# Patient Record
Sex: Male | Born: 1970
Health system: Southern US, Community
[De-identification: ages and names within clinical notes are randomized; demographics above are authoritative.]

## PROBLEM LIST (undated history)

## (undated) DIAGNOSIS — M549 Dorsalgia, unspecified: Secondary | ICD-10-CM

## (undated) DIAGNOSIS — I4891 Unspecified atrial fibrillation: Principal | ICD-10-CM

## (undated) DIAGNOSIS — F419 Anxiety disorder, unspecified: Secondary | ICD-10-CM

## (undated) DIAGNOSIS — I10 Essential (primary) hypertension: Secondary | ICD-10-CM

## (undated) DIAGNOSIS — R030 Elevated blood-pressure reading, without diagnosis of hypertension: Secondary | ICD-10-CM

## (undated) DIAGNOSIS — Z9119 Patient's noncompliance with other medical treatment and regimen: Secondary | ICD-10-CM

## (undated) DIAGNOSIS — R7303 Prediabetes: Secondary | ICD-10-CM

## (undated) DIAGNOSIS — K219 Gastro-esophageal reflux disease without esophagitis: Secondary | ICD-10-CM

## (undated) DIAGNOSIS — F32A Depression, unspecified: Secondary | ICD-10-CM

## (undated) DIAGNOSIS — Z91199 Patient's noncompliance with other medical treatment and regimen due to unspecified reason: Secondary | ICD-10-CM

## (undated) HISTORY — DX: Anxiety disorder, unspecified: F41.9

## (undated) HISTORY — DX: Dorsalgia, unspecified: M54.9

## (undated) HISTORY — DX: Depression, unspecified: F32.A

## (undated) HISTORY — DX: Essential (primary) hypertension: I10

## (undated) HISTORY — PX: COLONOSCOPY: SHX174

---

## 2003-10-25 ENCOUNTER — Inpatient Hospital Stay (HOSPITAL_COMMUNITY): Admission: EM | Admit: 2003-10-25 | Discharge: 2003-10-27 | Payer: Self-pay | Admitting: Emergency Medicine

## 2003-10-26 ENCOUNTER — Encounter: Payer: Self-pay | Admitting: Internal Medicine

## 2004-12-06 ENCOUNTER — Emergency Department (HOSPITAL_COMMUNITY): Admission: EM | Admit: 2004-12-06 | Discharge: 2004-12-06 | Payer: Self-pay | Admitting: Family Medicine

## 2005-07-17 ENCOUNTER — Observation Stay (HOSPITAL_COMMUNITY): Admission: AD | Admit: 2005-07-17 | Discharge: 2005-07-18 | Payer: Self-pay | Admitting: Internal Medicine

## 2005-07-17 ENCOUNTER — Ambulatory Visit: Payer: Self-pay | Admitting: Cardiology

## 2005-07-17 ENCOUNTER — Encounter: Payer: Self-pay | Admitting: Emergency Medicine

## 2005-07-17 ENCOUNTER — Encounter: Payer: Self-pay | Admitting: Cardiology

## 2006-02-02 ENCOUNTER — Inpatient Hospital Stay (HOSPITAL_COMMUNITY): Admission: EM | Admit: 2006-02-02 | Discharge: 2006-02-05 | Payer: Self-pay | Admitting: Family Medicine

## 2006-09-04 ENCOUNTER — Emergency Department (HOSPITAL_COMMUNITY): Admission: EM | Admit: 2006-09-04 | Discharge: 2006-09-05 | Payer: Self-pay | Admitting: Emergency Medicine

## 2009-10-01 ENCOUNTER — Emergency Department (HOSPITAL_COMMUNITY): Admission: EM | Admit: 2009-10-01 | Discharge: 2009-10-02 | Payer: Self-pay | Admitting: Emergency Medicine

## 2010-06-01 LAB — DIFFERENTIAL
Basophils Absolute: 0.1 10*3/uL (ref 0.0–0.1)
Basophils Relative: 1 % (ref 0–1)
Eosinophils Absolute: 0.2 10*3/uL (ref 0.0–0.7)
Eosinophils Relative: 2 % (ref 0–5)
Lymphocytes Relative: 35 % (ref 12–46)
Lymphs Abs: 3.3 10*3/uL (ref 0.7–4.0)
Monocytes Absolute: 0.8 10*3/uL (ref 0.1–1.0)
Monocytes Relative: 9 % (ref 3–12)
Neutro Abs: 5 10*3/uL (ref 1.7–7.7)
Neutrophils Relative %: 54 % (ref 43–77)

## 2010-06-01 LAB — POCT CARDIAC MARKERS
CKMB, poc: 1 ng/mL (ref 1.0–8.0)
Myoglobin, poc: 55.7 ng/mL (ref 12–200)
Troponin i, poc: <0.05 ng/mL (ref 0.00–0.09)

## 2010-06-01 LAB — POCT I-STAT, CHEM 8
BUN: 10 mg/dL (ref 6–23)
Calcium, Ion: 1.18 mmol/L (ref 1.12–1.32)
Chloride: 106 mEq/L (ref 96–112)
Creatinine, Ser: 1 mg/dL (ref 0.4–1.5)
Glucose, Bld: 88 mg/dL (ref 70–99)
HCT: 44 % (ref 39.0–52.0)
Hemoglobin: 15 g/dL (ref 13.0–17.0)
Potassium: 4 mEq/L (ref 3.5–5.1)
Sodium: 142 mEq/L (ref 135–145)
TCO2: 27 mmol/L (ref 0–100)

## 2010-06-01 LAB — CBC
HCT: 42.2 % (ref 39.0–52.0)
Hemoglobin: 14.2 g/dL (ref 13.0–17.0)
MCH: 33.4 pg (ref 26.0–34.0)
MCHC: 33.6 g/dL (ref 30.0–36.0)
MCV: 99.5 fL (ref 78.0–100.0)
Platelets: 280 10*3/uL (ref 150–400)
RBC: 4.24 MIL/uL (ref 4.22–5.81)
RDW: 12 % (ref 11.5–15.5)
WBC: 9.4 10*3/uL (ref 4.0–10.5)

## 2010-06-01 LAB — PROTIME-INR
INR: 0.96 (ref 0.00–1.49)
Prothrombin Time: 12.7 seconds (ref 11.6–15.2)

## 2010-06-01 LAB — APTT: aPTT: 28 seconds (ref 24–37)

## 2010-08-02 NOTE — Letter (Signed)
October 09, 2005     Lott Seelbach  [address]  [place], [place] [address]   RE:  SABIN, GIBEAULT  MRN:  [id number]  /  DOB:  1970/11/26   Dear Mr. Oleson:   I am writing this letter to inform you that you are being discharged from  our practice.  You were seen in the hospital back in May of this year.  However, on multiple visits, you have not shown in the past several years.  For clinical follow up with your history of hypertension, atrial  fibrillation, it is imperative that you continue to be followed by a  physician.  We would be happy to see you on an emergent basis, but advise  you to seek some sort of medical attention for continued outpatient follow  up.  Your medical records will be available for another caregiver at your  request.  All that is needed is Release of Information from you.   If you have any questions, please feel free to contact us.   Sincerely,      Pricilla Riffle, MD, [facility]   PVR/MedQ  DD:  10/10/2005  DT:  10/10/2005  Job #:  [id number]

## 2010-08-02 NOTE — H&P (Signed)
NAME:  Edward Johnson, Edward Johnson NO.:  [id number]   MEDICAL RECORD NO.:  [id number]                   PATIENT TYPE:  EMS   LOCATION:  ED                                   FACILITY:  [facility]   PHYSICIAN:  Isla Pence, M.D.             DATE OF BIRTH:  March 30, 1970   DATE OF ADMISSION:  10/24/2003  DATE OF DISCHARGE:                                HISTORY & PHYSICAL   IDENTIFYING INFORMATION/JUSTIFICATION FOR ADMISSION AND CARE:  This is a 40-  year-old African-American gentleman whose primary care physician is  currently Dr. Sherril Croon in [place], [place] although patient has just moved  to [facility] and is going to be choosing a physician within [place].   CHIEF COMPLAINT:  Shortness of breath and heart beating irregularly since  Sunday.   HISTORY OF PRESENT ILLNESS:  Initially the wife was the historian and once  the patient woke up he was able to confirm his history.  This patient has a  previous history of irregular heart beat, was told it was atrial  fibrillation, approximately 10 years ago.  At that time he was put on  Coumadin for a short period of time and since then he has been off of  Coumadin.  He was told that 1 chamber of his heart was thicker than the  other and that was the cause for his atrial fibrillation.  Since then he has  had episodes of irregular heart beats once very 3 months or so, and not  irregular otherwise.  These resolve before he can get to see his physician.  The wife notes that these are usually brought on by nightmares but no other  triggers are noticed.  With this episode or irregular heart beat he did have  a sense of shortness of breath but no chest pain.  No nausea, vomiting and  no orthopnea.  He has had no lower extremity swelling either.  He does drink  caffeinated drinks about 3 or 4 cans per day of soda, but no coffee, does  take small amounts of chocolate but no significant.  He has had no weight  loss medication  over-the-counter in more than a year.  He has not had an  herbal medicines, no decongestants or cold remedies either. He has never  done IV drugs.  He has never had a heart attack.  He does have history of  hypertension.  He has not had any alcoholic beverages over the past 3 weeks.  Prior to that may drink socially maybe 1 can 3x a week at the most.  He does  not have any diabetes mellitus either.  There is no family history of  something similar or any sudden cardiac death.  The patient has had no  recent weight gain, he has gained about 150 pounds since his teenage years.  Denies any weight loss or change in bowel habits either.  Note - in the  emergency room patient received a Cardizem bolus of 20 and placed on a drip  at 5 mg/Hr.   ALLERGIES:  No known drug allergies.   CURRENT MEDICATIONS:  Zestoretic (cannot tell me the dose breakdown on the  Zestoretic).   PAST MEDICAL HISTORY:  1. Significant only for hypertension.  2. No diabetes mellitus, hyperlipidemia or coronary artery disease.   REVIEW OF SYSTEMS:  As per history of present illness.  Once again he denies  orthopnea or chest pain, abdominal pain, denies melena or hematochezia,  denies constipation or diarrhea.  Denies any slurred speech or headache.  When asked if he snores, both patient and wife state yes.  When asked if he  has apneic spells the wife who is a nurse says yes.  Denies any urinary  symptoms of dysuria or hematuria, frequency of urination, also denies  polydipsia.   PAST SURGICAL HISTORY:  He has had minor lacerations that needed sutures in  his left forearm and his right finger but otherwise no major surgery.  He  still has his appendix, gallbladder, tonsils.   SOCIAL HISTORY:  He has been married for the past 7 years.  He has 2  children.  He works as a Runner, broadcasting/film/video.  Does not do any regular  exercise aside from what he does with work.  Does not smoke or drink  alcohol.  And as mentioned  earlier, has not used any IV drugs or any other  form of drugs.   FAMILY HISTORY:  There is hypertension in the parents.  Diabetes mellitus in  the mother.  There is no myocardial infarction or arrhythmias.  There is  rectal cancer in the mother at age 66, she is currently alive at 30.  There  is no prostate, breast or ovarian cancer.   PHYSICAL EXAMINATION:  VITAL SIGNS:  Initial vitals show a temperature of  97.3, blood pressure is 150/92, pulse was 86 but his EKG heart rate was 119  with rapid ventricular rate atrial fibrillation.  His saturations are 98% on  room air.  Respirations are 18.  GENERAL:  He is in no apparent distress.  He is a fairly large man,  certainly some amount of obesity also.  NECK:  No thyromegaly.  LUNGS:  Clear to auscultation bilaterally without crackles or wheezes.  HEART:  Irregularly irregular, I do not hear any audible murmurs.  ABDOMEN:  Protuberant secondary to abdominal obesity.  Bowel sounds are  normal.  Soft, nontender, no organomegaly.  BACK:  There is no spinous tenderness, no CVA tenderness.  LOWER EXTREMITIES:  There is no pretibial edema.  I could not get him to  relax enough to get DTR's on his knee jerks.  NEUROLOGIC EXAM:  There are no gross deficits noted.   LABORATORY DATA:  The initial EKG shows a rapid ventricular rate, atrial  fibrillation with a rate of 118; a repeat EKG done after he has been on a  Cardizem drip shows a rate of ventricular rate of 83 but it is still in  atrial fibrillation.  Do not see any other acute ST, T wave changes.  He  might have some nonspecific changes in lead 3.  Chest x-ray shows no acute  abnormalities, no cardiomegaly is appreciated.  His CBC shows a white count  of 9700, H&H of 14.9 and 43.6, platelet count of 352,000.  Sodium is 138,  potassium 2.3, chloride 104, CO2 27, glucose 119, BUN is  17, creatinine 1.4, calcium is normal at 9.2, total protein is 6.3, albumin 3.5.  Liver function  tests were  fairly unremarkable except for minimal increase in his ALT at 41,  the rest were fairly unremarkable.  His cardiac point of care markers x3  were all negative.  His PT and PTT were also in the normal range with PTT at  31, and PT at 12.3.   ASSESSMENT/PLAN:  1. Rapid ventricular rate atrial fibrillation which is now rate controlled     with the Cardizem drip.  It sounds as though he has paroxysmal atrial     fibrillation over the past 10 years.  Certainly this patient is a     candidate for anticoagulation initially with heparin and subsequently     with Coumadin.  We will bring him into the hospital, keep him on the     Cardizem drip but start him on Cardizem orally and in about an hour after     being on Cardizem orally we will try tapering him off of the Cardizem     drip.  He has already had a TSH drawn in the emergency room which will     need to be followed.  Will obtain a 2D echocardiogram.  Will also need to     get a cardiology consult in the morning to see if this patient is a     candidate for cardioversion.  Naturally a lot of it would also be     dependent on what his echocardiogram findings are.  Most likely his     atrial fibrillation may be related to left ventricular hypertrophy     secondary to history of hypertension.  2. History of hypertension.  Since we are going to use the Cardizem for     controlling his atrial fibrillation, will use that for now and hold off     on the ACE and hydrochlorothiazide combination but this may be needed.  3. Mild hypokalemia.  This is secondary to diuretic use of Zestoretic.  Will     replace.  4. Question of sleep apnea.  He will need evaluation as an outpatient once     he chooses a physician here in town.  5. Need for primary care physician in town.  Will consult case manager to     give the patient a physician directory.  6. GI prophylaxis.  Will give Protonix.                                               Isla Pence,  M.D.    RRV/MEDQ  D:  10/25/2003  T:  10/25/2003  Job:  [id number]

## 2010-08-02 NOTE — Op Note (Signed)
[name], [name]:  [id number]   MEDICAL RECORD NO.:  [id number]          PATIENT TYPE:  INP   LOCATION:  2035                         FACILITY:  [facility]   PHYSICIAN:  Cristy Hilts. Jacinto Halim, MD       DATE OF BIRTH:  03-25-1970   DATE OF PROCEDURE:  02/02/2006  DATE OF DISCHARGE:                               OPERATIVE REPORT   CARDIOLOGIST:  Cristy Hilts. Jacinto Halim, MD.   PROCEDURE PERFORMED:  Transesophageal echocardiographically guided  electrical cardioversion.   INDICATIONS:  This patient, Edward Johnson, is a 40 year old gentleman  with a history of known paroxysmal atrial fibrillation in the past,  obesity, history of hypertension, who was admitted to the hospital with  new onset of atrial fibrillation with rapid ventricular response.  Given  his new onset of atrial fibrillation, and the duration was greater than  24 hours, he was brought for TEE-guided electrical cardioversion.   DESCRIPTION OF PROCEDURE:  TEE revealed no evidence of left atrial clot  or left atrial thrombus.  Left atrial function was normal, and left  ventricular function was normal.  We then proceeded forward with direct-  current cardioversion.   Technique:  Using 300 mg of pentothal for deep sedation, 100 J of  synchronized biphasic defibrillator was utilized and direct-current  cardioversion was delivered with successful conversion of atrial  fibrillation to normal sinus rhythm.  The patient tolerated the  procedure.  No immediate complications noted.      Cristy Hilts. Jacinto Halim, MD  Electronically Signed     JRG/MEDQ  D:  02/04/2006  T:  02/05/2006  Job:  [id number]

## 2010-08-02 NOTE — Discharge Summary (Signed)
Edward Johnson, DORIAN NO.:  [id number]   MEDICAL RECORD NO.:  [id number]          PATIENT TYPE:  INP   LOCATION:  3708                         FACILITY:  [facility]   PHYSICIAN:  Pricilla Riffle, M.D.    DATE OF BIRTH:  11-23-1970   DATE OF ADMISSION:  07/17/2005  DATE OF DISCHARGE:  07/18/2005                                 DISCHARGE SUMMARY   Too much static, unable to transcribe.     ______________________________  April Humphrey, NP    ______________________________  Pricilla Riffle, M.D.    AH/MEDQ  D:  07/18/2005  T:  07/18/2005  Job:  [id number]

## 2010-08-02 NOTE — Discharge Summary (Signed)
NAMEVIPUL, CAFARELLI                ACCOUNT NO.:  [id number]   MEDICAL RECORD NO.:  [id number]          PATIENT TYPE:  INP   LOCATION:  2035                         FACILITY:  [facility]   PHYSICIAN:  Hind I Elsaid, MD      DATE OF BIRTH:  06/03/1970   DATE OF ADMISSION:  02/02/2006  DATE OF DISCHARGE:                               DISCHARGE SUMMARY   DISCHARGE DIAGNOSES:  1. Paroxysmal atrial fibrillation status post cardioversion and      transesophageal echocardiography which came back to normal sinus      rhythm.  2. Hypertension.  3. Possible obstructive sleep apnea.  4. History of medical noncompliance.   DISCHARGE MEDICATIONS:  1. Cardizem Extended Release 240 mg p.o. daily.  2. Flecainide 100 mg p.o. q12h.  3. Protonix 40 mg p.o. daily.  4. Lovenox 145 mg subcutaneous q.12h. to be followed with cardiology      on Monday with the possibility of Lovenox to be stopped.  5. Coumadin 5 mg p.o. daily.   CONSULTATION:  Cardiology consult was done by Dr. Jacinto Halim because of the  atrial fibrillation.   PROCEDURES:  The patient has echo already done on May 27 with an  ejection fraction 45 to 50%,  with mitral grossly normal, aortic valve  was normal in size, and aortic valve was grossly normal.  Pulmonary  valve was not visualized.  The tricuspid valve was normal, and there was  no pericardial effusion.  TEE was done on November 20 where an older  study was normal.  Left atrium was normal, left ventricle normal, and  there is no thrombus, and the whole study was completely normal.  This  is the transesophageal echocardiography which was done on November 20.  The patient underwent cardioversion with 100 joules with heart rate  converted back to normal sinus rhythm.   HOSPITAL COURSE:  The patient admitted to the hospital because of  shortness of breath and palpitations, please review the history and  physical exam done by admitting hospitalist.  The patient have a history  of  paroxysmal atrial fibrillation and has a similar result in 2005 when  the patient underwent TEE in 2005 with a normal left ventricular  ejection fraction, with normal valvular structures.  He was admitted in  May 2007 with the same problem and underwent TEE where ejection fraction  ws 50%, and no valvular abnormality, and normal left atrial size, and  another admission for atrial fibrillation was in June 2007.  The patient  had been noncompliant with the medication and outpatient followup  appointment.  So during hospitalization, the patient started on  Flecainide, and has TEE as the report  above.  Cardioversion was done  after that with 100 joules with conversion to normal sinus rhythm.  The  patient started on pharmacological treatment for reversion of normal  sinus rhythm, and also the patient started on Lovenox and Coumadin at  the same time.  Last INR was 1.5.  So, now for the patient the patient  to continue on Lovenox at the same dose as during hospitalization  therapeutic and Coumadin 5 mg p.o. daily to be followed up on Monday to  follow the PT/INR.  If therapeutic, between 2 and 3, the Lovenox should  be stopped.  So, the patient will receive Lovenox four to five days and  then follow with the cardiologist as outpatient for further  recommendations.   History of hypertension.  The patient to continue on Cardizem 240 mg  p.o. daily with a good response to the Cardizem with the blood pressure  between 117-72/ 100/57.  So, the patient to continue the same Cardizem  treatment for the blood pressure.   History of obesity and history of possibility of obstructive sleep  apnea.  The patient recommend to do sleep study as an outpatient.  Since  the patient has a history of paroxysmal atrial fibrillation, the cause  is unknown since the valvular structures were normal.  The left atrium  was normal.  We kind of thinking that this could be to obesity-induced  with obstructive sleep  apnea, so we recommend obstructive sleep apnea to  be done as outpatient.   DISPOSITION FOR THE PATIENT:  1. The patient to be discharged home on Lovenox and Coumadin,      Cardizem, and Flecainide.  The patient to follow on Monday.  The      patient to continue Lovenox for four to five days with the Coumadin      until the INR be therapeutic.  Case Management already arranged      Lovenox for the patient.  Lovenox teaching for subcutaneous dosing      was done.  The patient admitted his wife is a Engineer, civil (consulting); she can give      him the Lovenox.  The patient understands the risks and benefits of      anticoagulation.  The patient to follow on Monday with a cardiology      for morel adjustment of INR and possibility of stopping the      Lovenox.  2. Hypertension to follow up as an outpatient.  3. Sleep apnea.  Obesity with possibility plethmography to be done as      an outpatient.      Hind Bosie Helper, MD  Electronically Signed     HIE/MEDQ  D:  02/05/2006  T:  02/05/2006  Job:  [id number]

## 2010-08-02 NOTE — Consult Note (Signed)
NAMEKENYATTA, KEIDEL                            ACCOUNT NO.:  [id number]   MEDICAL RECORD NO.:  [id number]                   PATIENT TYPE:  INP   LOCATION:  0347                                 FACILITY:  [facility]   PHYSICIAN:  Jesse Sans. Wall, M.D.                DATE OF BIRTH:  [birth year]   DATE OF CONSULTATION:  10/25/2003  DATE OF DISCHARGE:                                   CONSULTATION   CHIEF COMPLAINT:  Cardiology consult for paroxysmal atrial fibrillation.   HISTORY OF PRESENT ILLNESS:  We were asked to see this 40 year old male who  was admitted to [facility] on October 24, 2003, with dyspnea and  irregular heart rate.  An admission EKG revealed atrial fibrillation with  rapid ventricular response, approximately 119 beats per minute, with  occasional PVCs and diffuse T-wave abnormalities.   The patient reported first having atrial fibrillation approximately 10 years  ago.  He was on Coumadin briefly at that time; however, at some point it was  discontinued.  He has had occasional episodes, approximately every three  months, which seem to last several days and then resolve spontaneously.  These episodes are associated with dyspnea and occasionally presyncope but  no chest pain, nausea, or edema.   When seen in the emergency room on the 9th, the patient was treated with IV  Cardizem.  He was later changed to p.o. Cardizem.  His rate is currently  controlled, but he remains in atrial fibrillation.  The patient states that  he is acutely aware of when he goes into atrial fibrillation, and this  episode started early Sunday morning and awoke him from sleep.  A 2 D echo  was performed today, and the results are currently pending.   PAST MEDICAL HISTORY:  Significant for hypertension.  He denies history of  diabetes, no history of previous MI, no history of hypertension.  There is a  question of obstructive sleep apnea.   ALLERGIES:  No known drug allergies.   Medications  prior to admission included Zestoretic.   Currently the patient is on:  1. aspirin 81 mg daily.  2. Cardizem CD 180 mg daily.  3. Protonix 40 mg daily.  4. Coumadin and heparin per pharmacy.  5. Desyrel at h.s. p.r.n.   SOCIAL HISTORY:  The patient is married.  He has two children.  He recently  moved to [place] from [place], [place].  He works as a Naval architect.  He does not use tobacco.  He drinks occasional alcohol.  He gets no regular  exercise.  He denies illicit drug use, denies recent use of decongestants or  herbal supplements.  He drinks three to four caffeinenated drinks per day.   FAMILY HISTORY:  The patient's mother is alive at age 79.  She has  hypertension and a history of rectal CA as well as diabetes mellitus but no  coronary disease.  His father is alive at age 54.  He has hypertension but  no coronary disease.  He has a brother and sister who are alive and well.   REVIEW OF SYSTEMS:  Totally negative except for as noted above.  He states  he does have occasional headache.  He does have intolerance to heat.  He has  had mild shortness of breath and presyncope associated with the recent onset  of atrial fibrillation.   PHYSICAL EXAMINATION:  GENERAL:  A pleasant 40 year old well-developed, well-  nourished African-American male in no acute distress.  VITAL SIGNS:  Blood pressure 113/76, pulse 78 and irregular, respirations  20, temperature 97.7.  HEENT:  Unremarkable.  NECK:  No bruits, no jugular venous distention.  CARDIAC:  An irregular irregular rhythm without murmur.  CHEST:  Lungs are clear.  ABDOMEN:  Obese, soft, nontender, with positive bowel sounds.  EXTREMITIES:  Pulses intact without significant edema.  SKIN:  Warm and dry.  NEUROLOGIC:  Grossly intact.   LABORATORY DATA:  Chest x-ray is within normal limits.  An EKG on admission  showed atrial fibrillation with ventricular response of 119 beats per minute  with diffuse T-wave abnormalities.   On October 25, 2003, and EKG was repeated  that again showed atrial fibrillation, but the ventricular response was  controlled at 83 beats per minute.  There were again nonspecific changes.   Other labs included point of care cardiac enzymes negative x3.  A PTT was 31  on admission, INR 0.9.  BUN 17, creatinine 1.4, potassium low at 3.3,  glucose 119, SGPT 41.  A CBC revealed hemoglobin 14.9, hematocrit 43.6, WBC  9.7 thousand, platelets 352,000.  A lipid profile and a TSH are currently  pending along with a 2 D echo.   IMPRESSION:  1. Paroxysmal atrial fibrillation, current episode greater than 48 hours.  2. 2 D echo pending.  3. Hypokalemia supplemented with follow-up chemistry ordered.  4. Question of obstructive sleep apnea.  5. History of hypertension.  6. Anticoagulation with heparin and Coumadin.   PLAN:  We will await the results of the echo and the TSH.  We will consider  a TEE-guided cardioversion if the patient does not convert spontaneously.  His rate is currently controlled with Cardizem. We will have him follow up  in the Coumadin clinic at the [facility] office after discharge.  We will  initiate Coumadin education while here in the hospital.     Delton See, P.A. [facility]                  Thomas C. Wall, M.D.    DR/MEDQ  D:  10/25/2003  T:  10/25/2003  Job:  [id number]   cc:   Dr. Josefa Half, [place]

## 2010-08-02 NOTE — Discharge Summary (Signed)
NAMELUBY, SEAMANS NO.:  [id number]   MEDICAL RECORD NO.:  [id number]          PATIENT TYPE:  INP   LOCATION:  3708                         FACILITY:  [facility]   PHYSICIAN:  Pricilla Riffle, M.D.    DATE OF BIRTH:  08/09/1970   DATE OF ADMISSION:  07/17/2005  DATE OF DISCHARGE:  07/18/2005                                 DISCHARGE SUMMARY   CHIEF COMPLAINT:  The patient is a 40 year old African-American male who had  presented to the emergency room with light headedness and was referred by  [facility] to our hospital due to light headedness.  The patient  noticed that he was having increasing heart rate and fluttering while  straining in the bathroom and has not felt well since then. He has  complained of light headedness, shortness of breath, dyspnea on exertion,  and anterior achy feeling.  Past medical history was significant for atrial  fibrillation in August 2005 a TEE in August 2005.  He was placed on Coumadin  and Cardizem and was discharged on October 25, 2003.  He has an ejection  fraction of 75% with 1+ mitral regurgitation.  Questionable OSA.  Hypertension.  He also has a history of noncompliance and follow up on his  medications.   DISCHARGE DIAGNOSIS:  Atrial flutter.   PROCEDURES PERFORMED DURING THIS HOSPITALIZATION:  None.   ALLERGIES:  NO KNOWN DRUG ALLERGIES   HOSPITAL COURSE:  The patient was admitted on Jul 17, 2005, and given one  dose of flecainide 300 mg x1.  He was seen on Jul 18, 2005, and was doing  very well, had not gone back into atrial flutter and remained in sinus  rhythm.  His thyroid levels were normal.  Dr. Gala Romney had seen the patient  and stated that if flecainide would work that we would prescribe the patient  a pill in a pocket flecainide for future atrial flutter.  The patient was  discharged on aspirin 325 mg daily, flecainide 300 mg as needed for flutter.  The patient is instructed to call and notify the office if  he has to take  that.  He was also discharged on 2 grams salt diet, low fat, low cholesterol  diet.  He is to follow up with Dr. Tenny Craw on Aug 01, 2005, at 2:15 in the  [facility] office.  He was also to follow up at the [facility].  He was also instructed to have his main medical doctor arrange for an  outpatient sleep study and thyroid ultrasound.  Total time of discharge is a  30 minutes with MD and NP time together.     ______________________________  April Humphrey, NP    ______________________________  Pricilla Riffle, M.D.    AH/MEDQ  D:  08/15/2005  T:  08/15/2005  Job:  [id number]

## 2010-08-02 NOTE — Discharge Summary (Signed)
Edward Johnson, Edward Johnson                            ACCOUNT NO.:  [id number]   MEDICAL RECORD NO.:  [id number]                   PATIENT TYPE:  INP   LOCATION:  0347                                 FACILITY:  [facility]   PHYSICIAN:  Jackie Plum, M.D.             DATE OF BIRTH:  Jul 22, 1970   DATE OF ADMISSION:  10/24/2003  DATE OF DISCHARGE:  10/27/2003                                 DISCHARGE SUMMARY   DISCHARGE DIAGNOSES:  1. Paroxysmal atrial fibrillation with rapid ventricular response, status     post transesophageal echocardiogram guided catheterization by Dr. Dietrich Pates of [facility] on October 26, 2003.  The patient is being     discharged home on Lovenox subcu injections and Coumadin for     anticoagulation.  2. History of hypertension.   DISCHARGE MEDICATIONS:  1. Cardizem 180 mg p.o. daily.  2. Aspirin 200 mg p.o. daily.  3. Protonix 40 mg p.o. daily.  4. Subcu Lovenox injections 150 mg q.12h.  5. Coumadin 10 mg daily until seen in the office on Monday, October 30, 2003,     by Dr. Dietrich Pates, at which time adjustment in dosage may be instituted     based on INR.   ACTIVITY:  As tolerated.   DIET:  Cardiac diet.   The patient has been offered publication on Warfarin.  He is to read it and  familiarize himself with effects of Warfarin.  He has an appointment to see  Dr. Dietrich Pates of cardiology on November 27, 2003, at 9:30 a.m.  He is to  choose a PCP from a list provided by social workers to see a physician in  about 3 weeks.   DISCHARGE LABORATORY DATA:  WBC 6.8, hemoglobin 12.9, hematocrit 37.9, MCV  94.8, platelet count 286, pro time 12.3, INR 0.9.  Heparin level on  fractionated 0.58.  Sodium 137, potassium 3.7, chloride 107, CO2 27, glucose  98, BUN 9, creatinine 1.0, calcium 8.5.  Total cholesterol 175, HDL 34, LDL  108.   OTHER DIAGNOSTIC WORK-UP OF SIGNIFICANCE:  TSH 2.658.  Transthoracic  echocardiogram and transesophageal echocardiogram done  on August 10 and 11,  respectively, indicated ejection fraction of 65-75% with absence of any left  atrial appendage thrombus.   DISPOSITION:  The patient is to go home.   CONDITION ON DISCHARGE:  Improved and satisfactory.   REASON FOR HOSPITALIZATION:  Atrial fibrillation with rapid ventricular  response.  The patient presented with dyspnea and irregular heart rate.  He  had been diagnosed with atrial fibrillation about 10 years prior to  presentation and had had some Coumadin treatment previously.  At time of  admission, the patient was not on any Coumadin.  The patient has history of  drinking caffeinated drinks, about 3-4 cans per day of soda but no coffee.  He had not any alcoholic beverage for about 3 weeks.  According to the  admission H&P by Dr. Frederico Hamman, the patient's admitting BP was 150/92,  temp of 97.3 degrees F.  His heart rate had come down to 86; however, EKG  done was indicative of heart rate of 119 per minute, and his saturations  were 98% on room air.  His cardiopulmonary exam was notable for clear lung  fields with irregularly irregular cardiac rhythm.  He did not have any edema  on extremity examination.  Did not have a JVD.  EKG showed atrial  fibrillation with rapid ventricular response, and lab work indicated  hypokalemia with potassium of 2.3 and normal point-of-care cardiac enzymes.  He was, therefore, admitted for management of his atrial fibrillation with  rapid ventricular response and hypokalemia.   HOSPITAL COURSE:  The patient was admitted to hospitalist service on  telemetry monitoring.  Serial cardiac enzymes were obtained and ruled out  any myocardial infarction. A 2 D echocardiogram was done with the results as  noted above.  Hypokalemia was appropriately corrected prior to discharge.  TSH was drawn, and it was within normal limits.  Rate control was achieved  with Cardizem which was initially given IV and subsequently changed to p.o.  with  constant good controls.  The patient was seen in consultation by Dr.  Jesse Sans. Wall and Dr. Dietrich Pates of [facility].  Their impression  was that the patient has paroxysmal atrial fibrillation due to a combination  of hypertension, hypokalemia, and multiple stimulants of caffeinated drinks.  They recommended transesophageal echocardiogram guided catheterization which  was done successfully the next day on October 26, 2003.  Since then, the  patient has maintained a sinus rhythm.  Did not have any chest pain,  shortness of breath, or dyspnea on exertion or any other cardiopulmonary  complaints of significance.  The patient was initially on heparin and  Coumadin.  Heparin was discontinued, and he was started on Lovenox today.   This morning I called Dr. Dietrich Pates of cardiology and discussed the patient  with her, and she agreed that patient could be appropriately discharged home  on Lovenox with Coumadin bridging.  She has offered to follow the patient's  INR and Coumadin dosing at the outpatient level.  On rounds this morning,  Mr. Whitecotton feels well without any chest pain, shortness of breath,  dizziness, palpitations.  His BP was 113/62, pulse rate of 87, respiratory  rate of 20, temperature 97.6 degrees F.  He does not have any JVD.  Lungs  are clear to auscultation, heart regular rate and rhythm without any  gallops.  He did not have any murmur.  Abdomen is obese, soft, nontender.  Extremity exam, noted not to be any cords or significant edema.  He is alert  and oriented x 3 and deemed appropriate for discharge today.  I spent some  time this morning to discuss with patient all of the workup that was done,  the rationale behind these workups, our impression as to the cause of his  cardiac problems, and the plan of care at the outpatient level.  His  questions were appropriately and satisfactorily answered.  I spent more than 30 minutes preparing this patient for discharge today.   He  is going to go home with Lovenox and Coumadin to be arranged through social  worker's assistance.  Jackie Plum, M.D.    GO/MEDQ  D:  10/27/2003  T:  10/27/2003  Job:  [id number]   cc:   Thomas C. Wall, M.D.   Pricilla Riffle, M.D.   Doreen Beam  [address]  [place]  [place] [address]  Fax: [phone]

## 2010-08-02 NOTE — H&P (Signed)
NAMECASHIUS, GRANDSTAFF NO.:  [id number]   MEDICAL RECORD NO.:  [id number]          PATIENT TYPE:  EMS   LOCATION:  MAJO                         FACILITY:  [facility]   PHYSICIAN:  Hillery Aldo, M.D.   DATE OF BIRTH:  02-May-1970   DATE OF ADMISSION:  02/02/2006  DATE OF DISCHARGE:                                HISTORY & PHYSICAL   PRIMARY CARE PHYSICIAN:  The patient is unassigned.   CHIEF COMPLAINT:  Shortness of breath, palpitations.   HISTORY OF PRESENT ILLNESS:  The patient is a 40 year old male with past  medical history of paroxysmal atrial fibrillation dating back to the age of  15.  The patient has been poorly compliant with medical therapies including  calcium channel blockade and therapeutic anticoagulation.  He has had 2  hospitalizations here at [facility] for recurrent paroxysmal atrial  fibrillation.  He had a TEE cardioversion in 2005.  He has failed to follow  up with cardiologists that have been following him and was dismissed from  that practice.  The patient states that he usually gets spells of arrhythmia  approximately one time per year.  This current episode started approximately  3 days ago and was triggered by picking up a heavy box.  The patient has  subsequently developed shortness of breath and ongoing palpitations.  He did  attempt to self-treat by restarting his Cardizem; however, this has not  completely resolved his symptoms.  He is being admitted for further  evaluation and treatment.   PAST MEDICAL HISTORY:  1. Hypertension.  2. Paroxysmal atrial fibrillation diagnosed at age 75 with history of TEE      cardioversion in 2005.  3. Possible obstructive sleep apnea.  4. 1+ mitral valve regurgitation.  5. Depressed LV function on 2-D echocardiogram and May of 2007 showing an      EF of 45-50%.  6. History of medical noncompliance.   FAMILY HISTORY:  Mother is alive at age 79 with hypertension, diabetes, and  rectal  cancer.  Father is alive at 22 with hypertension.  He has two healthy  siblings.   SOCIAL HISTORY:  The patient is married with two children.  He is a lifelong  nonsmoker.  He occasionally drinks alcohol socially.  He works as a Ecologist.   ALLERGIES:  NONE.   CURRENT MEDICATIONS:  None.  The patient recently started Cardizem 180 mg  daily but has not been taking this regularly.  He has not been on Coumadin  in quite some time.   REVIEW OF SYSTEMS:  The patient denies any fever or chills.  He has not had  any chest pain.  He has been short of breath but no cough.  No change in  bowel habits.  No nausea or vomiting.  No diarrhea.  No dysuria.  He has had  a recent headache, but it is currently gone.  He denies any recent increase  in his caffeine intake but admits to drinking several caffeinated sodas  daily.   PHYSICAL EXAMINATION:  VITAL SIGNS:  Temperature 98.4, pulse 91,  respirations 20, blood pressure 118/68, O2 saturation 99% on room air.  GENERAL:  Morbidly obese male in no distress.  HEENT:  Normocephalic, atraumatic.  PERRL.  EOMI.  Oropharynx clear.  NECK:  Supple, no thyromegaly, no lymphadenopathy, no jugular venous  distension.  CHEST:  Decreased breath sounds but clear bilaterally.  HEART:  Heart sounds are irregularly irregular and tachycardiac.  ABDOMEN:  Soft, nontender, nondistended with normoactive bowel sounds.  EXTREMITIES:  No clubbing, edema, cyanosis.  SKIN:  Dry.  No rashes.  NEUROLOGIC:  Alert and oriented x3.  Cranial nerves II-XII grossly intact.  Nonfocal.   DATA REVIEW:  EKG shows atrial fibrillation with rapid ventricular response  and mild ST elevation in lead V2 only.   Chest x-ray shows a nodular density projecting over the left costophrenic  angle.   LABORATORY DATA:  Sodium is 137, potassium 3.8, chloride 104, bicarb 28, BUN  6, creatinine 0.9, glucose 87.  Liver function studies are within normal  limits.  PT is 14, PTT 30.   Urinalysis is negative for nitrites, leukocyte  esterase.  Urine drug screen negative.  Coronary point-of-care markers are  negative x1.  White blood cell count is 8.6, hemoglobin 13.9, hematocrit 41,  platelets 317.   ASSESSMENT/PLAN:  1. Paroxysmal atrial fibrillation with rapid ventricular response,      recurrent.  We will admit the patient for rate control, initiate a      Cardizem drip if he cannot be controlled on p.o. medications.  We will      initiate anticoagulation with therapeutic dose Lovenox and oral      Coumadin.  Although his thyroid function has been normal in the past, I      will check a thyroid stimulating hormone as well as a brain natriuretic      peptide and cycle cardiac enzymes every 8 hours x3.  We will monitor      him on the telemetry unit.  We will get a case manager consultation, as      the patient will need followup and evaluation as to why he has been      medically noncompliant in the past.  2. Hypertension.  We will monitor the patient's blood pressure and      reinitiate treatment with Cardizem.  Further uptitration based on      response.  3. Prophylaxis.  Will initiate gastrointestinal prophylaxis with Protonix.      He will be commenced on anticoagulation treatment of his atrial      fibrillation which will prevent deep venous thrombosis.      Hillery Aldo, M.D.  Electronically Signed     CR/MEDQ  D:  02/02/2006  T:  02/03/2006  Job:  [id number]

## 2010-08-02 NOTE — H&P (Signed)
NAME:  Edward Johnson NO.:  [id number]   MEDICAL RECORD NO.:  [id number]          PATIENT TYPE:  EMS   LOCATION:  ED                           [facility]ACILITY:  [facility]   PHYSICIAN:  Arvilla Meres, M.D. LHCDATE OF BIRTH:  February 25, 1971   DATE OF ADMISSION:  07/17/2005  DATE OF DISCHARGE:                                HISTORY & PHYSICAL   SUMMARY OF HISTORY:  Edward Johnson is a 40 year old African-American male who  was referred yesterday from the office staff to the emergency room with his  complaints of palpitations and lightheadedness.  He presented this morning  to [facility] Emergency Room with the above complaints.  He stated since  approximately 2300 hours on Tuesday he noticed an increased heart rate and  fluttering sensation that began while he was straining to have a bowel  movement.  He states that he has not felt well since that time,  specifically, he describes lightheadedness, shortness of breath, dyspnea on  exertion, some anterior chest achy sensations that do not radiate nor are  they associated with nausea, vomiting, diaphoresis.  The last time his  achiness was a 0 was before the onset of these symptoms Tuesday evening.  He  is not sure of any alleviating or aggravating factors.  He denies any  syncope.  He denies prior history of exertional chest discomfort, shortness  of breath, or dyspnea of exertion.   It is noted that he stopped his Coumadin over a year ago.  He discontinued  his prescription for Cardizem several months ago and he has been  noncompliant in his prior arrangements for follow-up in regards to his PAF.   PAST MEDICAL HISTORY:  No known drug allergies.  He denies any prescription  medications or over-the-counter medications.  As previously mentioned above  he has not had Cardizem for several months and he stopped his Coumadin and  aspirin over a year ago.  He was hospitalized in August 2005 for paroxysmal  atrial fibrillation.  He  underwent an echocardiogram on October 25, 2003 that  showed an EF of 75%, 1+ MR.  He underwent TEE cardioversion on June 26, 2003 restoring normal sinus rhythm.  He was discharged home from the  hospital on aspirin, Coumadin, and Cardizem and for a while he had PT/INR  checks at [facility].  He never followed up as instructed  with Dr. Tenny Craw or with the instructions to obtain a primary care physician.  He has a history of hypertension, possible obstructive sleep apnea which has  never been evaluated.  He denies any surgeries.   SOCIAL HISTORY:  He resides in [place] with his wife and two children  ages 20 and 60.  He is a Char[employer] fundraiser.  He has never smoked.  He has an  occasional beer.  He denies any drugs, herbal medications, specific diet, or  hypertension.   FAMILY HISTORY:  His mother is alive at the age of 40 with a history of  hypertension, diabetes, rectal cancer.  His father is alive at 21 with a  history  of hypertension.  He has one brother, one sister alive and well.   REVIEW OF SYSTEMS:  Notable for possible weight gain, although patient is  not sure of how much over what duration.  He describes occasional headaches,  sinus problems, poor dentition with gum bleeding.  He has not seen a dentist  in the past several years.  Nocturia, upper extremity numbness in the  morning which resolved with position changes, depression and anxiety and  mood swings per his wife, GERD symptoms, and difficulty swallowing; however,  he denies choking on food.  He does snore and there is a possibility of  obstructive sleep apnea per his wife.   PHYSICAL EXAMINATION:  GENERAL:  Well-nourished, well-developed, obese  African-American male in no apparent distress.  VITAL SIGNS:  Temperature 97.8, blood pressure initially is 147/102 and is  now 107/80, pulse is 88 and irregular, respirations 18, 99% saturation on  room air.  HEENT:  Unremarkable except for poor dentition.   NECK:  Supple without lymphadenopathy, carotid bruits, or JVD.  There is  possible thyroid enlargement.  LUNGS:  Symmetrical excursion.  Clear to auscultation without rales,  rhonchi, or wheezing.  HEART:  PMI is not displaced.  He has an irregular irregular rhythm without  murmurs, rubs, clicks, or gallops.  All pulses are symmetrical, intact  without abdominal or femoral bruits.  SKIN/INTEGUMENT:  Intact.  He has a tattoo on the right chest wall and on  the right forearm.  ABDOMEN:  Obese.  Bowel sounds present without organomegaly, masses, or  tenderness.  EXTREMITIES:  Negative clubbing, cyanosis, edema.  MUSCULOSKELETAL:  Unremarkable.  NEUROLOGIC:  Unremarkable.   Chest x-ray is still pending at the time of this dictation.  EKG on  presentation showed atrial fibrillation with a ventricular rate of 88,  diffuse J-point elevation, but no active evidence of ischemia.  Compared to  old EKGs the atrial fibrillation is new.  On his old EKGs he does have  documented early repolarization.  H&H is 13.8 and 40.3, normal indices,  platelets 308, WBC 9.6.  Sodium 139, potassium 3.9, BUN 8, creatinine 1,  glucose 100.  Point of care markers negative x1.   IMPRESSION:  1.  Atrial fibrillation since Tuesday evening at approximately 11:30 with an      increased ventricular rate with activity.  2.  Associated prolonged chest discomfort and shortness of breath and      lightheadedness with above.  3.  Hypertension.  4.  Noncompliance with medications and medical follow-up.  5.  Possible thyromegaly.  6.  Possible obstructive sleep apnea.   DISPOSITION:  Dr. Gala Romney reviewed the patient's history, spoke with, and  examined the patient and agrees with the above.  We will give Edward Johnson 300  mg of flecainide in the emergency room.  If he fails to convert to normal  sinus rhythm we will plan a DC cardioversion this p.m. to restore normal sinus rhythm with outpatient follow-up.  If the  flecainide does convert him  to normal sinus rhythm we will continue to observe him for approximately six  hours in the emergency room, let him go home with a p.r.n. 300 mg flecainide  dosing for palpitations, and aspirin 325 for anticoagulation.  He has a  follow-up appointment with Dr. Tenny Craw on May 18 at 2:15 p.m.  Regardless of  outcome he will follow up with Dr. Tenny Craw.  We have also asked him to  establish himself with [facility] (per  his wife's wishes) to follow  up on arranging an outpatient thyroid ultrasound and an outpatient sleep  study.  While he is here in the emergency room we have drawn a hemoglobin  A1c, PTT, PT/INR, TSH, free T4 which are still pending at the time of this  dictation.   If he fails to convert with the flecainide plus/minus the DC cardioversion  he will be admitted and placed on IV anticoagulation and further  medications.   He was also advised to obtain a blood pressure cuff to begin a blood  pressure diary for outpatient follow-up with his primary care physician to  further treat his hypertension.      Joellyn Rued, P.A. [facility]      Arvilla Meres, M.D. [facility]  Electronically Signed    EW/MEDQ  D:  07/17/2005  T:  07/17/2005  Job:  [id number]   cc:   [facility]   Pricilla Riffle, M.D.  [address]  [address]  [place]  [place] [address]

## 2011-01-01 LAB — URINALYSIS, ROUTINE W REFLEX MICROSCOPIC
Bilirubin Urine: NEGATIVE
Glucose, UA: NEGATIVE
Hgb urine dipstick: NEGATIVE
Ketones, ur: NEGATIVE
Nitrite: NEGATIVE
Protein, ur: NEGATIVE
Specific Gravity, Urine: 1.027
Urobilinogen, UA: 1
pH: 6

## 2011-01-28 ENCOUNTER — Other Ambulatory Visit: Payer: Self-pay

## 2011-01-28 ENCOUNTER — Emergency Department (HOSPITAL_COMMUNITY): Payer: Self-pay

## 2011-01-28 ENCOUNTER — Encounter: Payer: Self-pay | Admitting: Emergency Medicine

## 2011-01-28 ENCOUNTER — Inpatient Hospital Stay (HOSPITAL_COMMUNITY)
Admission: EM | Admit: 2011-01-28 | Discharge: 2011-01-29 | DRG: 310 | Disposition: A | Discharge status: Home / Self Care | Payer: Self-pay | Attending: Infectious Diseases | Admitting: Infectious Diseases

## 2011-01-28 DIAGNOSIS — R0602 Shortness of breath: Secondary | ICD-10-CM | POA: Diagnosis present

## 2011-01-28 DIAGNOSIS — I4891 Unspecified atrial fibrillation: Secondary | ICD-10-CM

## 2011-01-28 DIAGNOSIS — Z91199 Patient's noncompliance with other medical treatment and regimen due to unspecified reason: Secondary | ICD-10-CM

## 2011-01-28 DIAGNOSIS — R030 Elevated blood-pressure reading, without diagnosis of hypertension: Secondary | ICD-10-CM | POA: Diagnosis present

## 2011-01-28 DIAGNOSIS — R079 Chest pain, unspecified: Secondary | ICD-10-CM

## 2011-01-28 DIAGNOSIS — R0789 Other chest pain: Secondary | ICD-10-CM

## 2011-01-28 DIAGNOSIS — R7309 Other abnormal glucose: Secondary | ICD-10-CM | POA: Diagnosis present

## 2011-01-28 DIAGNOSIS — Z9119 Patient's noncompliance with other medical treatment and regimen: Secondary | ICD-10-CM

## 2011-01-28 HISTORY — DX: Patient's noncompliance with other medical treatment and regimen due to unspecified reason: Z91.199

## 2011-01-28 HISTORY — DX: Unspecified atrial fibrillation: I48.91

## 2011-01-28 HISTORY — DX: Patient's noncompliance with other medical treatment and regimen: Z91.19

## 2011-01-28 HISTORY — DX: Elevated blood-pressure reading, without diagnosis of hypertension: R03.0

## 2011-01-28 LAB — DIFFERENTIAL
Basophils Absolute: 0.1 10*3/uL (ref 0.0–0.1)
Basophils Relative: 1 % (ref 0–1)
Eosinophils Absolute: 0.2 10*3/uL (ref 0.0–0.7)
Eosinophils Relative: 3 % (ref 0–5)
Lymphocytes Relative: 29 % (ref 12–46)
Lymphs Abs: 2.5 10*3/uL (ref 0.7–4.0)
Monocytes Absolute: 0.7 10*3/uL (ref 0.1–1.0)
Monocytes Relative: 8 % (ref 3–12)
Neutro Abs: 5.3 10*3/uL (ref 1.7–7.7)
Neutrophils Relative %: 60 % (ref 43–77)

## 2011-01-28 LAB — BASIC METABOLIC PANEL
BUN: 10 mg/dL (ref 6–23)
CO2: 30 mEq/L (ref 19–32)
Calcium: 9.5 mg/dL (ref 8.4–10.5)
Chloride: 101 mEq/L (ref 96–112)
Creatinine, Ser: 1.03 mg/dL (ref 0.50–1.35)
GFR calc Af Amer: >90 mL/min (ref >90)
GFR calc non Af Amer: 90 mL/min — ABNORMAL LOW (ref >90)
Glucose, Bld: 169 mg/dL — ABNORMAL HIGH (ref 70–99)
Potassium: 3.8 mEq/L (ref 3.5–5.1)
Sodium: 139 mEq/L (ref 135–145)

## 2011-01-28 LAB — CARDIAC PANEL(CRET KIN+CKTOT+MB+TROPI)
CK, MB: 2.9 ng/mL (ref 0.3–4.0)
CK, MB: 2.9 ng/mL (ref 0.3–4.0)
Relative Index: 1.4 (ref 0.0–2.5)
Relative Index: 1.6 (ref 0.0–2.5)
Total CK: 202 U/L (ref 7–232)
Total CK: 179 U/L (ref 7–232)
Troponin I: <0.3 ng/mL (ref <0.30)
Troponin I: <0.3 ng/mL (ref <0.30)

## 2011-01-28 LAB — RAPID URINE DRUG SCREEN, HOSP PERFORMED
Amphetamines: NOT DETECTED
Barbiturates: NOT DETECTED
Benzodiazepines: NOT DETECTED
Cocaine: NOT DETECTED
Opiates: NOT DETECTED
Tetrahydrocannabinol: NOT DETECTED

## 2011-01-28 LAB — HEMOGLOBIN A1C
Hgb A1c MFr Bld: 5.5 % (ref <5.7)
Mean Plasma Glucose: 111 mg/dL (ref <117)

## 2011-01-28 LAB — SEDIMENTATION RATE: Sed Rate: 6 mm/hr (ref 0–16)

## 2011-01-28 LAB — CBC
HCT: 43 % (ref 39.0–52.0)
Hemoglobin: 14.3 g/dL (ref 13.0–17.0)
MCH: 32 pg (ref 26.0–34.0)
MCHC: 33.3 g/dL (ref 30.0–36.0)
MCV: 96.2 fL (ref 78.0–100.0)
Platelets: 331 10*3/uL (ref 150–400)
RBC: 4.47 MIL/uL (ref 4.22–5.81)
RDW: 11.9 % (ref 11.5–15.5)
WBC: 8.8 10*3/uL (ref 4.0–10.5)

## 2011-01-28 LAB — TROPONIN I: Troponin I: <0.3 ng/mL (ref <0.30)

## 2011-01-28 LAB — TSH: TSH: 2.102 u[IU]/mL (ref 0.350–4.500)

## 2011-01-28 LAB — HIV ANTIBODY (ROUTINE TESTING W REFLEX): HIV: NONREACTIVE

## 2011-01-28 LAB — HEPARIN LEVEL (UNFRACTIONATED): Heparin Unfractionated: 0.3 IU/mL (ref 0.30–0.70)

## 2011-01-28 MED ORDER — DILTIAZEM HCL 100 MG IV SOLR
5.0000 mg/h | INTRAVENOUS | Status: DC
  Administered 2011-01-28 – 2011-01-29 (×2): 10 mg/h via INTRAVENOUS
  Filled 2011-01-28: qty 100

## 2011-01-28 MED ORDER — ENOXAPARIN SODIUM 40 MG/0.4ML ~~LOC~~ SOLN
40.0000 mg | SUBCUTANEOUS | Status: DC
  Filled 2011-01-28: qty 0.4

## 2011-01-28 MED ORDER — HEPARIN (PORCINE) IN NACL 100-0.45 UNIT/ML-% IJ SOLN
2050.0000 [IU]/h | INTRAMUSCULAR | Status: DC
  Administered 2011-01-28: 1850 [IU]/h via INTRAVENOUS
  Administered 2011-01-29: 2050 [IU]/h via INTRAVENOUS
  Filled 2011-01-28 (×4): qty 250

## 2011-01-28 MED ORDER — NITROGLYCERIN 0.4 MG SL SUBL
0.4000 mg | SUBLINGUAL_TABLET | SUBLINGUAL | Status: DC | PRN
Start: 2011-01-28 — End: 2011-01-29

## 2011-01-28 MED ORDER — DILTIAZEM HCL 100 MG IV SOLR
10.0000 mg/h | INTRAVENOUS | Status: DC
  Administered 2011-01-28: 5 mg/h via INTRAVENOUS
  Filled 2011-01-28: qty 100

## 2011-01-28 MED ORDER — SODIUM CHLORIDE 0.9 % IV BOLUS (SEPSIS)
1000.0000 mL | Freq: Once | INTRAVENOUS | Status: AC
  Administered 2011-01-28: 1000 mL via INTRAVENOUS

## 2011-01-28 MED ORDER — DILTIAZEM HCL ER 90 MG PO CP12
90.0000 mg | ORAL_CAPSULE | Freq: Two times a day (BID) | ORAL | Status: DC
  Filled 2011-01-28 (×3): qty 1

## 2011-01-28 MED ORDER — MORPHINE SULFATE 2 MG/ML IJ SOLN: 1.0000 mg | INTRAMUSCULAR | Status: DC | PRN

## 2011-01-28 MED ORDER — HEPARIN BOLUS VIA INFUSION
5000.0000 [IU] | Freq: Once | INTRAVENOUS | Status: AC
  Administered 2011-01-28: 5000 [IU] via INTRAVENOUS
  Filled 2011-01-28: qty 5000

## 2011-01-28 MED ORDER — SODIUM CHLORIDE 0.9 % IJ SOLN
3.0000 mL | Freq: Two times a day (BID) | INTRAMUSCULAR | Status: DC
  Administered 2011-01-28: 3 mL via INTRAVENOUS

## 2011-01-28 MED ORDER — DILTIAZEM HCL 100 MG IV SOLR
INTRAVENOUS | Status: AC
  Filled 2011-01-28: qty 100

## 2011-01-28 MED ORDER — ASPIRIN 81 MG PO CHEW
324.0000 mg | CHEWABLE_TABLET | Freq: Once | ORAL | Status: AC
  Administered 2011-01-28: 324 mg via ORAL
  Filled 2011-01-28: qty 4

## 2011-01-28 NOTE — ED Notes (Signed)
PT. REPORTS MIDSTERNAL CHEST PAIN WITH SOB ONSET LAST NIGHT, DENIES COUGH OR CONGESTION, NO NAUSEA OR VOMITTING , DIAPHORESIS.  TOOK 2 REGULAR ASA PRIOR TO ARRIVAL.

## 2011-01-28 NOTE — ED Provider Notes (Signed)
History     CSN: [id number] Arrival date & time: 01/28/2011  6:04 AM   First MD Initiated Contact with Patient 01/28/11 0740      Chief Complaint  Patient presents with  . Chest Pain    (Consider location/radiation/quality/duration/timing/severity/associated sxs/prior treatment) Patient is a 40 y.o. male presenting with chest pain. The history is provided by the patient.  Chest Pain   Chest Pain: Patient complains of chest pain. Onset was 9 hour ago, with improving course since that time. The patient describes the pain as intermittent, dull in nature, does not radiate. Patient rates pain as a 4/10 in intensity.  Associated symptoms are chest pressure/discomfort. Aggravating factors are none.  Alleviating factors are: none. Patient's cardiac risk factors are male gender.  Patient's risk factors for DVT/PE: none. Previous cardiac testing: none.     Past Medical History  Diagnosis Date  . Atrial fibrillation     History reviewed. No pertinent past surgical history.  No family history on file.  History  Substance Use Topics  . Smoking status: Never Smoker   . Smokeless tobacco: Not on file  . Alcohol Use: Yes     OCCASIONAL      Review of Systems  Cardiovascular: Positive for chest pain.    Allergies  Review of patient's allergies indicates no known allergies.  Home Medications  No current outpatient prescriptions on file.  BP 122/85  Pulse 118  Temp(Src) 97.4 F (36.3 C) (Oral)  Resp 18  SpO2 100%  Physical Exam  Nursing note and vitals reviewed. Constitutional: He is oriented to person, place, and time. He appears well-developed and well-nourished. No distress.  HENT:  Head: Normocephalic and atraumatic.  Eyes: Pupils are equal, round, and reactive to light.  Neck: Normal range of motion.  Cardiovascular: Intact distal pulses.  An irregularly irregular rhythm present.         Date: 01/28/2011  Rate: 120  Rhythm: atrial fibrillation and  indeterminate  QRS Axis: normal  Intervals: normal  ST/T Wave abnormalities: nonspecific ST/T changes  Conduction Disutrbances:none  Narrative Interpretation:   Old EKG Reviewed: unchanged     Pulmonary/Chest: Effort normal. No respiratory distress. He has no wheezes. He has no rales.  Abdominal: Normal appearance and bowel sounds are normal. He exhibits no distension.  Musculoskeletal: Normal range of motion.  Neurological: He is alert and oriented to person, place, and time. No cranial nerve deficit.  Skin: Skin is warm and dry. No rash noted. He is not diaphoretic.  Psychiatric: He has a normal mood and affect. His behavior is normal.    ED Course  Procedures (including critical care time)  Labs Reviewed  BASIC METABOLIC PANEL - Abnormal; Notable for the following:    Glucose, Bld 169 (*)    GFR calc non Af Amer 90 (*)    All other components within normal limits  TROPONIN I  CBC  DIFFERENTIAL  I-STAT, CHEM 8  I-STAT TROPONIN I   Dg Chest 2 View  01/28/2011  *RADIOLOGY REPORT*  Clinical Data: Chest pain.  Cough.  Short of breath.  Cold.  CHEST - 2 VIEW  Comparison: 10/01/2009.  Findings: Cardiopericardial silhouette within normal limits. Bilateral basilar atelectasis.  No focal consolidation.  Trachea midline.  IMPRESSION: Basilar atelectasis.  No acute cardiopulmonary disease.  Original Report Authenticated By: Andreas Newport, M.D.     No diagnosis found.    MDM   Plan will be to start a Cardizem drip and admit.  Patient's heart  rate decreased to 100  Patient's condition discussed with outpatient clinics who will admit patient   Nelia Shi, MD 01/28/11 [phone]

## 2011-01-28 NOTE — Consult Note (Signed)
[facility] Consult Note  Patient ID: Edward Johnson MRN: [id number], DOB/AGE: 21-Feb-1971   Admit date: 01/28/2011 Date of Consult: 01/28/2011 11:34 AM  Primary Physician: No primary provider on file. Primary Cardiologist: Cassell Clement M.D.  Pt. Profile: 40yoAAm w/ h/o atrial fibrillation w/ RVR and medical noncompliance presented to the [facility] for chest pain, SOB, and heart fluttering, found to be in A. Fib w/ RVR.  Problem List: Patient Active Hospital Problem List: Atrial fibrillation with rapid ventricular response HTN Medical Noncompliance Hyperglycemia  Past Medical History  Diagnosis Date   . Atrial fibrillation       Hypertension    Medical Noncompliance     History reviewed. No pertinent past surgical history.   Allergies: No Known Allergies  HPI: 40yom w/ h/o atrial fibrillation w/ RVR s/p TEE cardioversion x2 (2005 & 2007), chemical cardioversion (2005, 2011 with flecainide) and medical noncompliance presented to the [facility] for chest pain, SOB, and heart fluttering and was found to be in A. Fib w/ RVR. He reported his heart fluttering started last night around 2300 when he was getting ready for bed. It was sudden in onset and associated with sharp substernal chest pressure, sharp chest pains, shortness of breath and diaphoresis. Denies radiation, nausea, or dizziness. He relates this episode to similar symptoms he has felt in the past when he has gone into atrial fibrillation. He was unable to sleep last night due to the discomfort and shortness of breath, which worsened with lying flat and with deep breaths. He denies fever, chills, sweats, recent illnesses or sick contacts, chest pain with exertion, changes in bowel or urinary habits. He does report being under more stress than normal due to his mother being ill. He reports drinking two 12oz beers occasionally on the weekends and drinking coffee, caffeinated sodas, and tea daily. He denies illicit drug use.   Upon  arrival to the ED his EKG was significant for Atrial fibrillation with RVR, HR 120bpm, nonspecific ST/T wave changes. His Troponin POC was <0.30. His CXR revealed bibasilar atelectasis. He was initiated on a diltiazem drip which was successful in lowering his HR, but he continued in A. Fib and was admitted to the teaching service. Cardiology is being consulted for management of his atrial fibrillation.   Upon arrival to the medical floor his EKG revealed Atrial fibrillation HR 91 with diffuse ST elevation. He is rate controlled on a Diltiazem drip, but remains symptomatic.   Outpatient Medications: ASA 325mg  PO every other day  Inpatient Medications:  Medications Prior to Admission  Medication Dose Route Frequency Provider Last Rate Last Dose  . aspirin chewable tablet 324 mg  324 mg Oral Once Nelia Shi, MD   324 mg at 01/28/11 4098  . diltiazem (CARDIZEM) 100 mg in dextrose 5 % 100 mL infusion  5 mg/hr Intravenous Titrated Nelia Shi, MD 5 mL/hr at 01/28/11 1191 5 mg/hr at 01/28/11 4782  . sodium chloride 0.9 % bolus 1,000 mL  1,000 mL Intravenous Once Nelia Shi, MD   1,000 mL at 01/28/11 0830      History reviewed. No pertinent family history.   History   Social History  . Marital Status: Married    Spouse Name: N/A    Number of Children: N/A  . Years of Education: N/A   Occupational History  . Not on file.   Social History Main Topics  . Smoking status: Never Smoker   . Smokeless tobacco: Not on file  . Alcohol  Use: Yes     OCCASIONAL  . Drug Use:   . Sexually Active:    Other Topics Concern  . Not on file   Social History Narrative  . No narrative on file     Review of Systems: General: negative for chills, fever, night sweats or weight changes.  Cardiovascular: As per HPI: Otherwise negative for yspnea on exertion, edema, orthopnea, paroxysmal nocturnal dyspnea  Dermatological: negative for rash Respiratory: negative for cough or  wheezing Urologic: negative for hematuria Abdominal: negative for nausea, vomiting, diarrhea, bright red blood per rectum, melena, or hematemesis Neurologic: negative for visual changes, syncope, or dizziness All other systems reviewed and are otherwise negative except as noted above.  Physical Exam: Blood pressure 126/89, pulse 94, temperature 97.4 F (36.3 C), temperature source Oral, resp. rate 18, SpO2 97.00%.    General: Overweight, african [place] male, Well developed, well nourished, in no acute distress. Neck: Supple. Negative for carotid bruits. No JVD. Lungs: Clear bilaterally to auscultation without wheezes, rales, or rhonchi. Breathing is unlabored. Heart: Irregular rhythm,S1 S2. NO murmurs, rubs, or gallops appreciated. Abdomen: Soft, non-tender, non-distended with normoactive bowel sounds. No rebound/guarding. No obvious abdominal masses. Extremities: No clubbing, cyanosis or edema.  Distal pedal pulses are 2+ and equal bilaterally. Neuro: Alert and oriented X 3. Moves all extremities spontaneously. Psych:  Responds to questions appropriately with a normal affect.  Labs:  Lab Results  Component Value Date   WBC 8.8 01/28/2011   HGB 14.3 01/28/2011   HCT 43.0 01/28/2011   MCV 96.2 01/28/2011   PLT 331 01/28/2011    Lab 01/28/11 0621  NA 139  K 3.8  CL 101  CO2 30  BUN 10  CREATININE 1.03  CALCIUM 9.5  GLUCOSE 169*    Basename 01/28/11 0620  CKTOTAL --  CKMB --  TROPONINI <0.30    Radiology/Studies:  Dg Chest 2 View 01/28/2011  Findings: Cardiopericardial silhouette within normal limits. Bilateral basilar atelectasis.  No focal consolidation.  Trachea midline.  IMPRESSION: Basilar atelectasis.  No acute cardiopulmonary disease.     EKG: Atrial fibrillation w/ RVR, HR 120. Nonspecific ST/T wave changes. (In ED)  Atrial fibrillation HR 91 with diffuse ST elevation (On floor)  ASSESSMENT AND PLAN:  1. Atrial Fibrillation w/ RVR: He has an extensive  history of atrial fibrillation with RVR and multiple electrical/chemical cardioversions, however, due to financial implications he hasn't been able to comply with medical therapy or follow up with an outpatient practitioner. His heart rate is in the 90s on Diltiazem 5mg /hr. This will be increased to 10mg /hr in in hopes this will spontaneously convert him to NSR. A heparin drip will be initiated for anticoagulation. Obtain a 2D echo to assess his LV function. He will be NPO after midnight as he may go for a TEE cardioversion tomorrow if not cardioverted. Daily EKGs. TSH pending. His CHADS2 score will be assessed pending ECHO and A1C results. Will discuss need for anticoagulation with MD. 2. Diffuse ST Elevation: His symptoms and EKG are suggestive of possible pericarditis. Will get daily EKGs and a 2D echo. Will also obtain inflammatory markers (ESR, CRP, ANA, RF). 3. Medical noncompliance: He reports not being able to afford his medications or see an outpatient provider due to lack of insurance. He is currently employed, but has to wait 90 days to obtain insurance. Will have social work discuss options for assistance with him. 4. Hypertension: Stable at this time. Continue diltiazem. 5. Hyperglycemia: A1C pending   Signed,  HOPE, JESSICA, PA-C 01/28/2011, 11:34 AM  Attending: History reviewed with patient.  Pain in chest is worse with lying down and partially relieved by sitting up.  EKG raises question of pericarditis but benign early repolarization is also likely.  No pericardial rub heard on careful auscultation.   All available labs, radiology testing, previous records reviewed.  Agree with documented assessment and plan.  Will begin IV heparin now, increase IV diltiazem.  Possible TEE cardioversion Wednesday if he is still in atrial fib.01/28/2011, 1:21 PM, Cassell Clement

## 2011-01-28 NOTE — Progress Notes (Signed)
*  PRELIMINARY RESULTS* Echocardiogram 2D Echocardiogram has been performed.  Glean Salen [facility] RDCS 01/28/2011, 4:40 PM

## 2011-01-28 NOTE — Progress Notes (Signed)
ANTICOAGULATION CONSULT NOTE - Follow Up Consult  Pharmacy Consult for  Heparin Indication: atrial fibrillation  No Known Allergies  Patient Measurements: Height: 6\' 6"  (198.1 cm) Weight: 325 lb (147.419 kg) IBW/kg (Calculated) : 91.4  Adjusted Body Weight: 124 kg   Labs:  Basename 01/28/11 2045 01/28/11 2035 01/28/11 1355 01/28/11 0621 01/28/11 0620  HGB -- -- -- 14.3 --  HCT -- -- -- 43.0 --  PLT -- -- -- 331 --  APTT -- -- -- -- --  LABPROT -- -- -- -- --  INR -- -- -- -- --  HEPARINUNFRC 0.30 -- -- -- --  CREATININE -- -- -- 1.03 --  CKTOTAL -- 179 202 -- --  CKMB -- 2.9 2.9 -- --  TROPONINI -- <0.30 <0.30 -- <0.30    Assessment:    Heparin level tonight is 0.30. Low therapeutic, but may drop some by AM.  Goal of Therapy:  Heparin level 0.3-0.7 units/ml   Plan:     Will increase drip to 2050 units/hr to try to keep level in target range.  Next labs in AM.  Dennie Fetters 01/28/2011,11:05 PM

## 2011-01-28 NOTE — Progress Notes (Signed)
ANTICOAGULATION CONSULT NOTE - Initial Consult  Pharmacy Consult for Heparin Indication: AFib  No Known Allergies  Patient Measurements: Height: 6\' 6"  (198.1 cm) Weight: 325 lb (147.419 kg) IBW/kg (Calculated) : 91.4  Adjusted Body Weight: 124 kg  Vital Signs: Temp: 97.4 F (36.3 C) (11/13 0608) Temp src: Oral (11/13 0608) BP: 140/83 mmHg (11/13 1345) Pulse Rate: 95  (11/13 1345)  Labs:  Basename 01/28/11 0621 01/28/11 0620  HGB 14.3 --  HCT 43.0 --  PLT 331 --  APTT -- --  LABPROT -- --  INR -- --  HEPARINUNFRC -- --  CREATININE 1.03 --  CKTOTAL -- --  CKMB -- --  TROPONINI -- <0.30   Estimated Creatinine Clearance: 155 ml/min (by C-G formula based on Cr of 1.03).  Medical History: Past Medical History  Diagnosis Date  . Atrial fibrillation   . History of noncompliance with medical treatment   . Benign essential hypertension   . Angina   . Shortness of breath     Medications:  Scheduled:    . aspirin  324 mg Oral Once  . diltiazem  90 mg Oral Q12H  . sodium chloride  1,000 mL Intravenous Once  . sodium chloride  3 mL Intravenous Q12H  . DISCONTD: diltiazem      . DISCONTD: enoxaparin  40 mg Subcutaneous Q24H   Goal of Therapy:  Heparin level 0.3-0.7 units/ml   Assessment and Plan:  40yo male with extensive h/o AFib & multiple interventions, presents in AFib today- to start Heparin.  No h/o bleeding problems.  Pt also with noted h/o of medical non-compliance.  1.  Heparin 5000units IV x 1, 1850 units/hr 2.  Heparin level 6 hours 3.  Daily Heparin level and CBC  Hiatt, Kendra P 01/28/2011,2:07 PM

## 2011-01-28 NOTE — ED Notes (Signed)
Pt sts he has been in afib in the past.  Pt is in afib and rate is 100-120s and irregular.  Pt sts that pain and fluttering that started last nite at 2300.  Pt sts it is hard to breath.  Dr. Radford Pax has seen and examined patient

## 2011-01-28 NOTE — H&P (Signed)
Hospital Admission Note Date: 01/28/2011  Patient name: Edward Johnson Medical record number: [id number] Date of birth: [birth year] Age: 40 y.o. Gender: male PCP: No primary provider on file.  Medical Service: Internal Medicine Teaching Service - [place]  Attending physician:  Dr, Ninetta Lights    1st Contact: Dr. Milbert Coulter    Pager: [phone] 2nd Contact: Dr. Loistine Chance    Pager: [phone]  After 5 pm or weekends: 1st Contact:      Pager: [phone] 2nd Contact:      Pager: [phone]  Chief Complaint: heart palpitations  History of Present Illness: Pt is a 40 y/o M with PMH significant for paraoxysmal a.fib who presents with c/o acute onset of heart palpitations, SOB, and chest discomfort that began at 11pm on the evening prior to admission that began while attempting to fall asleep.  He admits to at least 10 episodes of similar symptoms previously and reports prior cardioversion for a.fib.  He is not currently on coumadin nor any other medications to control his heart rate.  He states an ablation was recommended by a cardiologist during a previous episode > 35yr ago but he declined the procedure at that time.  His symptoms are improved in the ER on a dilt gtt.  He denies c/p, doe, syncope, h/a, fever, chills, or other complaint.  Past Medical History:  Paroxysmal a.fib  - s/p cardioversion in 2007 - EF 45-50% per echo 2007  Meds: none  Allergies: NKDA  Family hx: Mother: HTN, DM, uterine ca Father: HTN  Social hx: Pt lives alone in [place], [place].  He is single.  He denies tobacco and illicit drug use.  Rarely uses EtOH.  Works as a [employer].  Review of Systems: Pertinent items are noted in HPI.  Physical Exam: Blood pressure 126/89, pulse 94, temperature 97.4 F (36.3 C), temperature source Oral, resp. rate 18, SpO2 97.00%. GEN: No apparent distress.  Alert and oriented x 3.  Pleasant, conversant, and cooperative to exam. HEENT: head is autraumatic and normocephalic.  Neck is supple  without palpable masses or lymphadenopathy.  No JVD or carotid bruits.  Vision intact.  EOMI.  PERRLA.  Sclerae anicteric.  Conjunctivae without pallor or injection. Mucous membranes are moist.  Oropharynx is without erythema, exudates, or other abnormal lesions.  Dentition is poor with numerous teeth missing. RESP:  Lungs are clear to ascultation bilaterally with good air movement.  No wheezes, ronchi, or rubs. CARDIOVASCULAR: regular rate, irregular l rhythm.  Clear S1, S2, no murmurs, gallops, or rubs. ABDOMEN: soft, non-tender, non-distended.  Bowels sounds present in all quadrants and normoactive.  No palpable masses. EXT: warm and dry.  Peripheral pulses equal, intact, and +2 globally.  No clubbing or cyanosis.  Trace edema in bilateral lower extremities. SKIN: warm and dry with normal turgor.  No rashes or abnormal lesions observed. NEURO: CN II-XII grossly intact.  Muscle strength +5/5 in bilateral upper and lower extremities.  Sensation is grossly intact.  No focal deficit.   Lab results: Basic Metabolic Panel:  [facility] 01/28/11 0621  NA 139  K 3.8  CL 101  CO2 30  GLUCOSE 169*  BUN 10  CREATININE 1.03  CALCIUM 9.5  MG --  PHOS --   CBC:  Basename 01/28/11 0621  WBC 8.8  NEUTROABS 5.3  HGB 14.3  HCT 43.0  MCV 96.2  PLT 331   Cardiac Enzymes:  Basename 01/28/11 0620  CKTOTAL --  CKMB --  CKMBINDEX --  TROPONINI <0.30    Imaging  results:  Dg Chest 2 View  01/28/2011  *RADIOLOGY REPORT*  Clinical Data: Chest pain.  Cough.  Short of breath.  Cold.  CHEST - 2 VIEW  Comparison: 10/01/2009.  Findings: Cardiopericardial silhouette within normal limits. Bilateral basilar atelectasis.  No focal consolidation.  Trachea midline.  IMPRESSION: Basilar atelectasis.  No acute cardiopulmonary disease.  Original Report Authenticated By: Andreas Newport, M.D.    Other results: EKG: a fib  Assessment & Plan by Problem: # A. Fib with RVR:  Pt initial EKG reveals a.fib with HR  of ~120.   He is currently rate controlled on a dilt gtt starting in the ER at a rate of 5mg /hr but remains in atrial fibrillation.  His CHADSVASC score is 0 and given the probable onset of his sx occuring w/in the last 12 hrs, he may be a candidate for cardrioversion and not require immediate anticoagulation given his extremely low risk of thromboembolic stroke.  He may also benefit from cardioablation that was recommended previously; he states he is now willing to undergo this procedure if recommended by cardiology.   - Admit to tele - Cycle cardiac enzymes to complete eval for ACS, although this seems less likely - EKG in am - WIll transition to oral diltiazem - Cards consulted for recommendations re: cardioversion and possible ablation - NTG and morphine prn chest pain/discomfort - RIsk stratify with FLP and A1c - Check TSH   #DVT prophy: lovenox  Signed: MILLS,KRISTIN 01/28/2011, 10:58 AM    Internal Medicine Teaching Service Attending Note Date: 01/29/2011  Patient name: Edward Johnson  Medical record number: [id number]  Date of birth: 1971/01/22   I have seen and evaluated Edward Johnson and discussed their care with the Residency Team.   40 yo M with hx recurrent episodes of PAF previous non-adherence/lost to follow up. He returns with repeat episode of palpitations, chest pain and sob. This am he feels better, has CP only with laying flat on his bed.   Physical Exam: Blood pressure 167/76, pulse 79, temperature 97.7 F (36.5 C), temperature source Oral, resp. rate 18, height 6\' 6"  (1.981 m), weight 147.419 kg (325 lb), SpO2 98.00%. BP 167/76  Pulse 79  Temp(Src) 97.7 F (36.5 C) (Oral)  Resp 18  Ht 6\' 6"  (1.981 m)  Wt 147.419 kg (325 lb)  BMI 37.56 kg/m2  SpO2 98%  General Appearance:    Alert, cooperative, no distress, appears stated age  Head:    Normocephalic, without obvious abnormality, atraumatic  Eyes:    PERRL, conjunctiva/corneas clear, EOM's intact,              Throat:   Lips, mucosa, and tongue normal; teeth and gums normal  Neck:   Supple, symmetrical, trachea midline, no adenopathy;       thyroid:  No enlargement/tenderness/nodules;       Lungs:     Clear to auscultation bilaterally, respirations unlabored     Heart:    Regular rate and rhythm, S1 and S2 normal, no murmur, rub   or gallop  Abdomen:     Soft, non-tender, bowel sounds active all four quadrants,    no masses, no organomegaly        Extremities:   Extremities normal, atraumatic; mild edema     Skin:   Skin color, texture, turgor normal, no rashes or lesions  Lymph nodes:   Cervical, supraclavicular,  Neurologic:   CNII-XII intact. Normal strength,          Lab results:  Results for orders placed during the hospital encounter of 01/28/11 (from the past 24 hour(s))  CARDIAC PANEL(CRET KIN+CKTOT+MB+TROPI)     Status: Normal   Collection Time   01/28/11  1:55 PM      Component Value Range   Total CK 202  7 - 232 (U/L)   CK, MB 2.9  0.3 - 4.0 (ng/mL)   Troponin I <0.30  <0.30 (ng/mL)   Relative Index 1.4  0.0 - 2.5   HIV ANTIBODY (ROUTINE TESTING)     Status: Normal   Collection Time   01/28/11  1:56 PM      Component Value Range   HIV NON REACTIVE  NON REACTIVE   HEMOGLOBIN A1C     Status: Normal   Collection Time   01/28/11  1:56 PM      Component Value Range   Hemoglobin A1C 5.5  <5.7 (%)   Mean Plasma Glucose 111  <117 (mg/dL)  TSH     Status: Normal   Collection Time   01/28/11  1:56 PM      Component Value Range   TSH 2.102  0.350 - 4.500 (uIU/mL)  RHEUMATOID FACTOR     Status: Normal   Collection Time   01/28/11  1:56 PM      Component Value Range   Rheumatoid Factor <10  <=14 (IU/mL)  SEDIMENTATION RATE     Status: Normal   Collection Time   01/28/11  1:56 PM      Component Value Range   Sed Rate 6  0 - 16 (mm/hr)  C-REACTIVE PROTEIN     Status: Abnormal   Collection Time   01/28/11  1:56 PM      Component Value Range   CRP 0.10 (*) <0.60  (mg/dL)  URINE RAPID DRUG SCREEN (HOSP PERFORMED)     Status: Normal   Collection Time   01/28/11  2:01 PM      Component Value Range   Opiates NONE DETECTED  NONE DETECTED    Cocaine NONE DETECTED  NONE DETECTED    Benzodiazepines NONE DETECTED  NONE DETECTED    Amphetamines NONE DETECTED  NONE DETECTED    Tetrahydrocannabinol NONE DETECTED  NONE DETECTED    Barbiturates NONE DETECTED  NONE DETECTED   CARDIAC PANEL(CRET KIN+CKTOT+MB+TROPI)     Status: Normal   Collection Time   01/28/11  8:35 PM      Component Value Range   Total CK 179  7 - 232 (U/L)   CK, MB 2.9  0.3 - 4.0 (ng/mL)   Troponin I <0.30  <0.30 (ng/mL)   Relative Index 1.6  0.0 - 2.5   HEPARIN LEVEL     Status: Normal   Collection Time   01/28/11  8:45 PM      Component Value Range   Heparin Unfractionated 0.30  0.30 - 0.70 (IU/mL)  HEPARIN LEVEL     Status: Normal   Collection Time   01/29/11  6:02 AM      Component Value Range   Heparin Unfractionated 0.44  0.30 - 0.70 (IU/mL)  CBC     Status: Normal   Collection Time   01/29/11  6:02 AM      Component Value Range   WBC 7.0  4.0 - 10.5 (K/uL)   RBC 4.35  4.22 - 5.81 (MIL/uL)   Hemoglobin 13.7  13.0 - 17.0 (g/dL)   HCT 11.9  14.7 - 82.9 (%)   MCV 95.9  78.0 - 100.0 (  fL)   MCH 31.5  26.0 - 34.0 (pg)   MCHC 32.9  30.0 - 36.0 (g/dL)   RDW 16.1  09.6 - 04.5 (%)   Platelets 304  150 - 400 (K/uL)  LIPID PANEL     Status: Abnormal   Collection Time   01/29/11  6:02 AM      Component Value Range   Cholesterol 193  0 - 200 (mg/dL)   Triglycerides 409  <811 (mg/dL)   HDL 42  >91 (mg/dL)   Total CHOL/HDL Ratio 4.6     VLDL 21  0 - 40 (mg/dL)   LDL Cholesterol 478 (*) 0 - 99 (mg/dL)  CARDIAC PANEL(CRET KIN+CKTOT+MB+TROPI)     Status: Normal   Collection Time   01/29/11  6:05 AM      Component Value Range   Total CK 155  7 - 232 (U/L)   CK, MB 2.7  0.3 - 4.0 (ng/mL)   Troponin I <0.30  <0.30 (ng/mL)   Relative Index 1.7  0.0 - 2.5     Imaging results:   Dg Chest 2 View  01/28/2011  *RADIOLOGY REPORT*  Clinical Data: Chest pain.  Cough.  Short of breath.  Cold.  CHEST - 2 VIEW  Comparison: 10/01/2009.  Findings: Cardiopericardial silhouette within normal limits. Bilateral basilar atelectasis.  No focal consolidation.  Trachea midline.  IMPRESSION: Basilar atelectasis.  No acute cardiopulmonary disease.  Original Report Authenticated By: Andreas Newport, M.D.    Assessment and Plan: I agree with the formulated Assessment and Plan with the following changes:  PAF- he is scheduled for cardioversion this AM. I spoke with him (as has the team and cardiology) and he is interested in ablation. He may need anticoagulation in the intervening period.

## 2011-01-29 ENCOUNTER — Telehealth: Payer: Self-pay | Admitting: Cardiology

## 2011-01-29 ENCOUNTER — Encounter (HOSPITAL_COMMUNITY): Payer: Self-pay | Admitting: Internal Medicine

## 2011-01-29 ENCOUNTER — Encounter (HOSPITAL_COMMUNITY): Payer: Self-pay | Admitting: Certified Registered"

## 2011-01-29 ENCOUNTER — Encounter (HOSPITAL_COMMUNITY): Payer: Self-pay | Admitting: *Deleted

## 2011-01-29 ENCOUNTER — Inpatient Hospital Stay (HOSPITAL_COMMUNITY): Payer: Self-pay | Admitting: Certified Registered"

## 2011-01-29 ENCOUNTER — Encounter (HOSPITAL_COMMUNITY)
Admission: EM | Disposition: A | Discharge status: Home / Self Care | Payer: Self-pay | Source: Home / Self Care | Attending: Infectious Diseases

## 2011-01-29 DIAGNOSIS — I4891 Unspecified atrial fibrillation: Principal | ICD-10-CM

## 2011-01-29 HISTORY — PX: CARDIOVERSION: SHX1299

## 2011-01-29 HISTORY — PX: TEE WITHOUT CARDIOVERSION: SHX5443

## 2011-01-29 LAB — CBC
HCT: 41.7 % (ref 39.0–52.0)
Hemoglobin: 13.7 g/dL (ref 13.0–17.0)
MCH: 31.5 pg (ref 26.0–34.0)
MCHC: 32.9 g/dL (ref 30.0–36.0)
MCV: 95.9 fL (ref 78.0–100.0)
Platelets: 304 10*3/uL (ref 150–400)
RBC: 4.35 MIL/uL (ref 4.22–5.81)
RDW: 11.9 % (ref 11.5–15.5)
WBC: 7 10*3/uL (ref 4.0–10.5)

## 2011-01-29 LAB — CARDIAC PANEL(CRET KIN+CKTOT+MB+TROPI)
CK, MB: 2.7 ng/mL (ref 0.3–4.0)
Relative Index: 1.7 (ref 0.0–2.5)
Total CK: 155 U/L (ref 7–232)
Troponin I: <0.3 ng/mL (ref <0.30)

## 2011-01-29 LAB — BASIC METABOLIC PANEL
BUN: 8 mg/dL (ref 6–23)
CO2: 27 mEq/L (ref 19–32)
Calcium: 9.6 mg/dL (ref 8.4–10.5)
Chloride: 101 mEq/L (ref 96–112)
Creatinine, Ser: 0.82 mg/dL (ref 0.50–1.35)
GFR calc Af Amer: >90 mL/min (ref >90)
GFR calc non Af Amer: >90 mL/min (ref >90)
Glucose, Bld: 96 mg/dL (ref 70–99)
Potassium: 3.9 mEq/L (ref 3.5–5.1)
Sodium: 134 mEq/L — ABNORMAL LOW (ref 135–145)

## 2011-01-29 LAB — PROTIME-INR
INR: 1.06 (ref 0.00–1.49)
Prothrombin Time: 14 seconds (ref 11.6–15.2)

## 2011-01-29 LAB — LIPID PANEL
Cholesterol: 193 mg/dL (ref 0–200)
HDL: 42 mg/dL (ref >39)
LDL Cholesterol: 130 mg/dL — ABNORMAL HIGH (ref 0–99)
Total CHOL/HDL Ratio: 4.6 RATIO
Triglycerides: 103 mg/dL (ref <150)
VLDL: 21 mg/dL (ref 0–40)

## 2011-01-29 LAB — ANTI-NUCLEAR AB-TITER (ANA TITER): ANA Titer 1: NEGATIVE

## 2011-01-29 LAB — RHEUMATOID FACTOR: Rhuematoid fact SerPl-aCnc: <10 IU/mL (ref <=14)

## 2011-01-29 LAB — ANA: Anti Nuclear Antibody(ANA): POSITIVE — AB

## 2011-01-29 LAB — C-REACTIVE PROTEIN: CRP: 0.1 mg/dL — ABNORMAL LOW (ref <0.60)

## 2011-01-29 LAB — HEPARIN LEVEL (UNFRACTIONATED): Heparin Unfractionated: 0.44 IU/mL (ref 0.30–0.70)

## 2011-01-29 SURGERY — CARDIOVERSION
Anesthesia: Moderate Sedation | Wound class: Clean

## 2011-01-29 MED ORDER — SODIUM CHLORIDE 0.9 % IJ SOLN: 3.0000 mL | INTRAMUSCULAR | Status: DC | PRN

## 2011-01-29 MED ORDER — FENTANYL CITRATE 0.05 MG/ML IJ SOLN
INTRAMUSCULAR | Status: AC
  Filled 2011-01-29: qty 2

## 2011-01-29 MED ORDER — FENTANYL CITRATE 0.05 MG/ML IJ SOLN
INTRAMUSCULAR | Status: DC | PRN
  Administered 2011-01-29: 25 ug via INTRAVENOUS

## 2011-01-29 MED ORDER — ASPIRIN 325 MG PO TABS: 325.0000 mg | ORAL_TABLET | Freq: Every day | ORAL | Status: AC

## 2011-01-29 MED ORDER — BENZOCAINE 20 % MT SOLN
1.0000 "application " | OROMUCOSAL | Status: DC | PRN
  Filled 2011-01-29: qty 57

## 2011-01-29 MED ORDER — PROPOFOL 10 MG/ML IV EMUL
INTRAVENOUS | Status: DC | PRN
  Administered 2011-01-29: 100 mg via INTRAVENOUS

## 2011-01-29 MED ORDER — SODIUM CHLORIDE 0.9 % IV SOLN: 250.0000 mL | INTRAVENOUS | Status: DC

## 2011-01-29 MED ORDER — SODIUM CHLORIDE 0.45 % IV SOLN: INTRAVENOUS | Status: DC

## 2011-01-29 MED ORDER — MIDAZOLAM HCL 10 MG/2ML IJ SOLN
INTRAMUSCULAR | Status: AC
  Filled 2011-01-29: qty 2

## 2011-01-29 MED ORDER — FENTANYL CITRATE 0.05 MG/ML IJ SOLN: 250.0000 ug | Freq: Once | INTRAMUSCULAR | Status: DC

## 2011-01-29 MED ORDER — MIDAZOLAM HCL 10 MG/2ML IJ SOLN
INTRAMUSCULAR | Status: DC | PRN
  Administered 2011-01-29 (×3): 2 mg via INTRAVENOUS

## 2011-01-29 MED ORDER — SODIUM CHLORIDE 0.9 % IV SOLN
INTRAVENOUS | Status: DC | PRN
  Administered 2011-01-29: 11:00:00 via INTRAVENOUS

## 2011-01-29 MED ORDER — BUTAMBEN-TETRACAINE-BENZOCAINE 2-2-14 % EX AERO
INHALATION_SPRAY | CUTANEOUS | Status: DC | PRN
  Administered 2011-01-29: 2 via TOPICAL

## 2011-01-29 MED ORDER — MIDAZOLAM HCL 10 MG/2ML IJ SOLN: 10.0000 mg | Freq: Once | INTRAMUSCULAR | Status: DC

## 2011-01-29 NOTE — Progress Notes (Signed)
ANTICOAGULATION CONSULT NOTE - Follow Up Consult  Pharmacy Consult for Heparin Indication: A.Fib  No Known Allergies  Patient Measurements: Height: 6\' 6"  (198.1 cm) Weight: 325 lb (147.419 kg) IBW/kg (Calculated) : 91.4    Vital Signs: Temp: 96.9 F (36.1 C) (11/14 0924) Temp src: Oral (11/14 0924) BP: 141/78 mmHg (11/14 1206) Pulse Rate: 79  (11/14 0924)  Labs:  Basename 01/29/11 1006 01/29/11 0605 01/29/11 0602 01/28/11 2045 01/28/11 2035 01/28/11 1355 01/28/11 0621  HGB -- -- 13.7 -- -- -- 14.3  HCT -- -- 41.7 -- -- -- 43.0  PLT -- -- 304 -- -- -- 331  APTT -- -- -- -- -- -- --  LABPROT 14.0 -- -- -- -- -- --  INR 1.06 -- -- -- -- -- --  HEPARINUNFRC -- -- 0.44 0.30 -- -- --  CREATININE 0.82 -- -- -- -- -- 1.03  CKTOTAL -- 155 -- -- 179 202 --  CKMB -- 2.7 -- -- 2.9 2.9 --  TROPONINI -- <0.30 -- -- <0.30 <0.30 --   Estimated Creatinine Clearance: 194.7 ml/min (by C-G formula based on Cr of 0.82).   Medications:  Scheduled:    . heparin  5,000 Units Intravenous Once  . midazolam  10 mg Intravenous Once  . DISCONTD: diltiazem  90 mg Oral Q12H  . DISCONTD: fentaNYL  250 mcg Intravenous Once  . DISCONTD: sodium chloride  3 mL Intravenous Q12H   Goal of Therapy:  Heparin level 0.3-0.7 units/ml   Assessment & Plan:  41yo male on heparin for A.Fib, s/p DCCV this AM.  Heparin level therapeutic on 2050units/hr, with stable CBC.  No problems noted.  Will continue current rate and f/u AM.    Marisue Humble P 01/29/2011,2:06 PM

## 2011-01-29 NOTE — Preoperative (Signed)
Beta Blockers   Reason not to administer Beta Blockers:Not Applicable2 

## 2011-01-29 NOTE — Telephone Encounter (Signed)
Dr. Manson Passey beeper [phone], calling from [facility] to set up appt for pt per Dr. Patty Sermons, however when I went to schedule the pt he had been discharged from our practice 04-2010, who would he like to have him see? Dr. Manson Passey requesting to be notified of where pt will be going

## 2011-01-29 NOTE — Progress Notes (Signed)
Pt discharged home with wife via private vehicle. Assessment unchanged from this am, in a NSR. Ulyess Mort, RN

## 2011-01-29 NOTE — Progress Notes (Signed)
Subjective: Overnight, the patient remained in atrial fibrillation.  He continues to note palpitations, though less chest pain and shortness of breath.  Plan for TEE cardioversion today.  Objective: Vital signs in last 24 hours: Filed Vitals:   01/28/11 1044 01/28/11 1345 01/28/11 2200 01/29/11 0600  BP: 126/89 140/83 127/89 167/76  Pulse: 94 95 76 79  Temp:   97.7 F (36.5 C) 97.7 F (36.5 C)  TempSrc:   Oral Oral  Resp: 18 20 16 18   Height:  6\' 6"  (1.981 m)    Weight:  325 lb (147.419 kg)    SpO2: 97% 98% 95% 98%    Physical Exam: General: alert, cooperative, sitting in a chair HEENT: pupils equal round and reactive to light, vision grossly intact, oropharynx clear and non-erythematous  Neck: supple, no lymphadenopathy, JVD, or carotid bruits Lungs: clear to ascultation bilaterally, normal work of respiration, no wheezes, rales, ronchi Heart: irregular rhythm, regular rate, no murmurs, gallops, or robs Abdomen: soft, non-tender, non-distended, normal bowel sounds Msk: no joint edema, warmth, or erythema Extremities: no cyanosis, clubbing, or edema Neurologic: alert & oriented X3, cranial nerves II-XII intact, strength grossly intact, sensation intact to light touch  Lab Results: Basic Metabolic Panel:  Lab 01/28/11 0454  NA 139  K 3.8  CL 101  CO2 30  GLUCOSE 169*  BUN 10  CREATININE 1.03  CALCIUM 9.5  MG --  PHOS --   CBC:  Lab 01/29/11 0602 01/28/11 0621  WBC 7.0 8.8  NEUTROABS -- 5.3  HGB 13.7 14.3  HCT 41.7 43.0  MCV 95.9 96.2  PLT 304 331   Cardiac Enzymes:  Lab 01/29/11 0605 01/28/11 2035 01/28/11 1355  CKTOTAL 155 179 202  CKMB 2.7 2.9 2.9  CKMBINDEX -- -- --  TROPONINI <0.30 <0.30 <0.30   Hemoglobin A1C:  Lab 01/28/11 1356  HGBA1C 5.5   Fasting Lipid Panel:  Lab 01/29/11 0602  CHOL 193  HDL 42  LDLCALC 130*  TRIG 103  CHOLHDL 4.6  LDLDIRECT --   TSH: 2.102 HIV: negative UDS: negative CRP: 0.1 ESR: 6 INR:  0.96  Studies/Results: Dg Chest 2 View  01/28/2011  *RADIOLOGY REPORT*  Clinical Data: Chest pain.  Cough.  Short of breath.  Cold.  CHEST - 2 VIEW  Comparison: 10/01/2009.  Findings: Cardiopericardial silhouette within normal limits. Bilateral basilar atelectasis.  No focal consolidation.  Trachea midline.  IMPRESSION: Basilar atelectasis.  No acute cardiopulmonary disease.  Original Report Authenticated By: Andreas Newport, M.D.   Medications: I have reviewed the patient's current medications. Scheduled Meds:   . heparin  5,000 Units Intravenous Once  . sodium chloride  1,000 mL Intravenous Once  . sodium chloride  3 mL Intravenous Q12H  . DISCONTD: diltiazem  90 mg Oral Q12H  . DISCONTD: diltiazem      . DISCONTD: enoxaparin  40 mg Subcutaneous Q24H   Continuous Infusions:   . diltiazem (CARDIZEM) infusion 10 mg/hr (01/29/11 0544)  . heparin 2,050 Units/hr (01/29/11 0340)  . DISCONTD: diltiazem (CARDIZEM) infusion 5 mg/hr (01/28/11 0822)   PRN Meds:.morphine injection, nitroGLYCERIN  Assessment/Plan: The patient is a 40 yo man with a history of paroxysmal afib, presenting with an episode of palpitations, found to be in afib, hemodynamically stable.  1. Afib with RVR - HR initially in 120's, dropped to 80's with diltiazem.  Patient remained in afib on telemetry overnight.  CHA2DS2-VASc score = 0.  Plan for TEE with cardioversion today, and follow-up with outpatient cardiology for ablation. -  TEE with cardioversion today -decrease diltiazem to 5 mg/hr -CE's negative x3 -telemetry showed continued afib overnight -2D echo performed, results pending  2. Financial difficulty - the patient has a history of financial constraints, preventing him from affording his discharge medications, or going to outpatient follow-up appointments. -patient will f/u with [facility] after discharge -SW to help obtain orange card    LOS: 1 day   Janalyn Harder 01/29/2011, 8:50 AM

## 2011-01-29 NOTE — Discharge Summary (Signed)
Internal Medicine Teaching [facility] Discharge Note  Name: Edward Johnson MRN: [id number] DOB: 06-09-1970 40 y.o.  Date of Admission: 01/28/2011  6:04 AM Date of Discharge: 01/29/2011 Attending Physician: Johny Sax, MD  Discharge Diagnosis: 1. Atrial fibrillation with rapid ventricular rate - s/p TEE cardioversion 01/29/11, converted to NSR 2. Borderline blood pressures - borderline BP values in acute hospitalization, no data from outpatient setting, likely not true HTN 3. Financial Difficulties - patient recently started a job, but will not have health insurance for another 2-3 months  Discharge Medications: Current Discharge Medication List    START taking these medications   Details  aspirin (BAYER ASPIRIN) 325 MG tablet Take 1 tablet (325 mg total) by mouth daily. Qty: 30 tablet, Refills: 0        Disposition and follow-up:   [name] was discharged from [facility] in stable and improved condition, with resolution of palpitations, chest pain, and shortness of breath.  The patient will follow-up with Dr. Eben Burow in the [facility] on 02/14/11, at 11:15 a.m., at which point we will assess for recurrence of symptoms of atrial fibrillation, and give the patient information on applying for the [facility].  No lab work is needed at that visit, unless otherwise deemed necessary.    The patient will also follow-up with [facility] for ablation, given his history of paroxysmal afib.  The patient was recently fired from [facility], likely for not going to his previous appointments.  However, Dr. Patty Sermons agreed to see the patient in his office if the patient is able to keep his [facility] appointments, in about 2-3 months (at which point the patient will have insurance through his job, and can better afford the procedure).  Follow-up Appointments: Discharge Orders    Future Appointments: Provider: Department: Dept Phone: Center:   02/14/2011 11:15 AM Ankit  Eben Burow [facility] [phone] [facility]     Future Orders Please Complete By Expires   Diet - low sodium heart healthy      Increase activity slowly      Discharge instructions      Comments:   You were admitted with Atrial Fibrillation.  We performed a Cardioversion to stop this abnormal rhythm, and you had no complications following this procedure.  This condition may happen again.  The best way to prevent this from happening again is to undergo an Ablation, for which you will need to follow-up with Cardiology.   Call MD for:  temperature >100.4      Call MD for:  persistant nausea and vomiting      Call MD for:  severe uncontrolled pain      Call MD for:      Comments:   Recurrent palpitations, shortness of breath, or chest pain.      Consultations: Cassell Clement, MD, Cardiology  Procedures Performed:  Dg Chest 2 View  01/28/2011  *RADIOLOGY REPORT*  Clinical Data: Chest pain.  Cough.  Short of breath.  Cold.  CHEST - 2 VIEW  Comparison: 10/01/2009.  Findings: Cardiopericardial silhouette within normal limits. Bilateral basilar atelectasis.  No focal consolidation.  Trachea midline.  IMPRESSION: Basilar atelectasis.  No acute cardiopulmonary disease.  Original Report Authenticated By: Andreas Newport, M.D.    2D Echo: Study Conclusions  - Left ventricle: Inferobasal hypokinesis The cavity size was normal. The estimated ejection fraction was 55%. - Atrial septum: No defect or patent foramen ovale was identified.  Admission HPI:  Pt is a 40 y/o  M with PMH significant for paraoxysmal a.fib who presents with c/o acute onset of heart palpitations, SOB, and chest discomfort that began at 11pm on the evening prior to admission that began while attempting to fall asleep. He admits to at least 10 episodes of similar symptoms previously and reports prior cardioversion for a.fib. He is not currently on coumadin nor any other medications to control his heart rate. He states an ablation was  recommended by a cardiologist during a previous episode > 65yr ago but he declined the procedure at that time. His symptoms are improved in the ER on a dilt gtt. He denies c/p, doe, syncope, h/a, fever, chills, or other complaint.  Physical Exam:  Blood pressure 126/89, pulse 94, temperature 97.4 F (36.3 C), temperature source Oral, resp. rate 18, SpO2 97.00%.  GEN: No apparent distress. Alert and oriented x 3. Pleasant, conversant, and cooperative to exam.  HEENT: head is autraumatic and normocephalic. Neck is supple without palpable masses or lymphadenopathy. No JVD or carotid bruits. Vision intact. EOMI. PERRLA. Sclerae anicteric. Conjunctivae without pallor or injection. Mucous membranes are moist. Oropharynx is without erythema, exudates, or other abnormal lesions. Dentition is poor with numerous teeth missing.  RESP: Lungs are clear to ascultation bilaterally with good air movement. No wheezes, ronchi, or rubs.  CARDIOVASCULAR: regular rate, irregular l rhythm. Clear S1, S2, no murmurs, gallops, or rubs.  ABDOMEN: soft, non-tender, non-distended. Bowels sounds present in all quadrants and normoactive. No palpable masses.  EXT: warm and dry. Peripheral pulses equal, intact, and +2 globally. No clubbing or cyanosis. Trace edema in bilateral lower extremities.  SKIN: warm and dry with normal turgor. No rashes or abnormal lesions observed.  NEURO: CN II-XII grossly intact. Muscle strength +5/5 in bilateral upper and lower extremities. Sensation is grossly intact. No focal deficit.  Admission Labs: Na 139, K 3.8, Cl 101, CO2 30, BUN 10, Cr 1.03, Glucose 169, WBC 8.8, Hb 14.3, HCT 43.0, PLT 331.  Hospital Course by problem list: 1. Atrial fibrillation with rapid ventricular rate - The patient has a history of paroxysmal afib, presenting in afib with RVR.  A diltiazem drip was started, which controlled the patient's rate from the 120's down to the 80's.  The patient was continued on the diltiazem  drip overnight, and remained rate controlled, but remained in afib.  The patient was taken for TEE with cardioversion on 11/14, which was performed without complications.  The patient was noted to be in normal sinus rhythm post-op, and no evidence of thrombus was noted during the procedure.  The patient will plan to undergo ablation in 2-3 months to more definitively correct this problem, with [facility] (see hospital follow-up section above).  Given the patient's CHA2DS2-VASc score of 0, and his non-compliance with medications and loss to follow-up in the past, as well as his financial constraints, we decided to discharge the patient on only aspirin, rather than pursue aggressive anticoagulation.  This issue can be further discussed and modified in the outpatient setting.  2. Borderline blood pressures - The patient has had borderline BP values during several acute hospitalizations in the past.  However, the patient has always been lost to follow-up, and we have no data on the patient's BP values in the outpatient setting.  The patient had previously been given a diagnosis of hypertension in prior discharge summaries, but a review of his vital signs for the past 5+ years reveals no definitive data for diagnosis of true HTN.  During this admission,  the patient's SBP ranged from about the 100's to the 140's, on diltiazem.  The patient will be followed in the [facility], and his BP's will be closely followed.  3. Financial Difficulties - The patient has a history of financial difficulties, and has previously been non-compliant with discharge medications or outpatient medical appointments.  The patient has recently started a truck driving job, and notes that he will have health insurance coverage in about 2-3 months.  We will assist the patient in obtaining an [employer] Card in the outpatient setting to help him with medical costs until his insurance becomes active.   Discharge Vitals:  BP 141/78  Pulse 79   Temp(Src) 96.9 F (36.1 C) (Oral)  Resp 14  Ht 6\' 6"  (1.981 m)  Wt 325 lb (147.419 kg)  BMI 37.56 kg/m2  SpO2 94%  Discharge Labs:  Results for orders placed during the hospital encounter of 01/28/11 (from the past 24 hour(s))  CARDIAC PANEL(CRET KIN+CKTOT+MB+TROPI)     Status: Normal   Collection Time   01/28/11  1:55 PM      Component Value Range   Total CK 202  7 - 232 (U/L)   CK, MB 2.9  0.3 - 4.0 (ng/mL)   Troponin I <0.30  <0.30 (ng/mL)   Relative Index 1.4  0.0 - 2.5   HIV ANTIBODY (ROUTINE TESTING)     Status: Normal   Collection Time   01/28/11  1:56 PM      Component Value Range   HIV NON REACTIVE  NON REACTIVE   HEMOGLOBIN A1C     Status: Normal   Collection Time   01/28/11  1:56 PM      Component Value Range   Hemoglobin A1C 5.5  <5.7 (%)   Mean Plasma Glucose 111  <117 (mg/dL)  TSH     Status: Normal   Collection Time   01/28/11  1:56 PM      Component Value Range   TSH 2.102  0.350 - 4.500 (uIU/mL)  ANA     Status: Abnormal   Collection Time   01/28/11  1:56 PM      Component Value Range   ANA POSITIVE (*) NEGATIVE   RHEUMATOID FACTOR     Status: Normal   Collection Time   01/28/11  1:56 PM      Component Value Range   Rheumatoid Factor <10  <=14 (IU/mL)  SEDIMENTATION RATE     Status: Normal   Collection Time   01/28/11  1:56 PM      Component Value Range   Sed Rate 6  0 - 16 (mm/hr)  C-REACTIVE PROTEIN     Status: Abnormal   Collection Time   01/28/11  1:56 PM      Component Value Range   CRP 0.10 (*) <0.60 (mg/dL)  URINE RAPID DRUG SCREEN (HOSP PERFORMED)     Status: Normal   Collection Time   01/28/11  2:01 PM      Component Value Range   Opiates NONE DETECTED  NONE DETECTED    Cocaine NONE DETECTED  NONE DETECTED    Benzodiazepines NONE DETECTED  NONE DETECTED    Amphetamines NONE DETECTED  NONE DETECTED    Tetrahydrocannabinol NONE DETECTED  NONE DETECTED    Barbiturates NONE DETECTED  NONE DETECTED   CARDIAC PANEL(CRET  KIN+CKTOT+MB+TROPI)     Status: Normal   Collection Time   01/28/11  8:35 PM      Component Value Range  Total CK 179  7 - 232 (U/L)   CK, MB 2.9  0.3 - 4.0 (ng/mL)   Troponin I <0.30  <0.30 (ng/mL)   Relative Index 1.6  0.0 - 2.5   HEPARIN LEVEL     Status: Normal   Collection Time   01/28/11  8:45 PM      Component Value Range   Heparin Unfractionated 0.30  0.30 - 0.70 (IU/mL)  HEPARIN LEVEL     Status: Normal   Collection Time   01/29/11  6:02 AM      Component Value Range   Heparin Unfractionated 0.44  0.30 - 0.70 (IU/mL)  CBC     Status: Normal   Collection Time   01/29/11  6:02 AM      Component Value Range   WBC 7.0  4.0 - 10.5 (K/uL)   RBC 4.35  4.22 - 5.81 (MIL/uL)   Hemoglobin 13.7  13.0 - 17.0 (g/dL)   HCT 45.4  09.8 - 11.9 (%)   MCV 95.9  78.0 - 100.0 (fL)   MCH 31.5  26.0 - 34.0 (pg)   MCHC 32.9  30.0 - 36.0 (g/dL)   RDW 14.7  82.9 - 56.2 (%)   Platelets 304  150 - 400 (K/uL)  LIPID PANEL     Status: Abnormal   Collection Time   01/29/11  6:02 AM      Component Value Range   Cholesterol 193  0 - 200 (mg/dL)   Triglycerides 130  <865 (mg/dL)   HDL 42  >78 (mg/dL)   Total CHOL/HDL Ratio 4.6     VLDL 21  0 - 40 (mg/dL)   LDL Cholesterol 469 (*) 0 - 99 (mg/dL)  CARDIAC PANEL(CRET KIN+CKTOT+MB+TROPI)     Status: Normal   Collection Time   01/29/11  6:05 AM      Component Value Range   Total CK 155  7 - 232 (U/L)   CK, MB 2.7  0.3 - 4.0 (ng/mL)   Troponin I <0.30  <0.30 (ng/mL)   Relative Index 1.7  0.0 - 2.5   BASIC METABOLIC PANEL     Status: Abnormal   Collection Time   01/29/11 10:06 AM      Component Value Range   Sodium 134 (*) 135 - 145 (mEq/L)   Potassium 3.9  3.5 - 5.1 (mEq/L)   Chloride 101  96 - 112 (mEq/L)   CO2 27  19 - 32 (mEq/L)   Glucose, Bld 96  70 - 99 (mg/dL)   BUN 8  6 - 23 (mg/dL)   Creatinine, Ser 6.29  0.50 - 1.35 (mg/dL)   Calcium 9.6  8.4 - 52.8 (mg/dL)   GFR calc non Af Amer >90  >90 (mL/min)   GFR calc Af Amer >90  >90  (mL/min)  PROTIME-INR     Status: Normal   Collection Time   01/29/11 10:06 AM      Component Value Range   Prothrombin Time 14.0  11.6 - 15.2 (seconds)   INR 1.06  0.00 - 1.49     Signed: Janalyn Harder 01/29/2011, 1:43 PM

## 2011-01-29 NOTE — Telephone Encounter (Signed)
noted 

## 2011-01-29 NOTE — Anesthesia Postprocedure Evaluation (Signed)
  Anesthesia Post-op Note  Patient: Edward Johnson  Procedure(s) Performed:  CARDIOVERSION; TRANSESOPHAGEAL ECHOCARDIOGRAM (TEE)  Patient Location: PACU  Anesthesia Type: General  Level of Consciousness: awake and sedated  Airway and Oxygen Therapy: Patient Spontanous Breathing and Patient connected to nasal cannula oxygen  Post-op Pain: none  Post-op Assessment: Post-op Vital signs reviewed and Respiratory Function Stable  Post-op Vital Signs: Reviewed and stable  Complications: No apparent anesthesia complications

## 2011-01-29 NOTE — Transfer of Care (Signed)
Immediate Anesthesia Transfer of Care Note  Patient: Edward Johnson  Procedure(s) Performed:  CARDIOVERSION; TRANSESOPHAGEAL ECHOCARDIOGRAM (TEE)  Patient Location: PACU  Anesthesia Type: General  Level of Consciousness: awake and sedated  Airway & Oxygen Therapy: Patient Spontanous Breathing and Patient connected to nasal cannula oxygen  Post-op Assessment: Report given to PACU RN and Post -op Vital signs reviewed and stable  Post vital signs: Reviewed and stable  Complications: No apparent anesthesia complications

## 2011-01-29 NOTE — Procedures (Signed)
TEE results in echo system. No LAA thrombus. Cardioversion Note - Patient sedated by anesthesia with diprovan 100 mg IV x 1. Synchronized DCCV 120 joules resulted in Sinus rhythm. No immediate complications. Continue IV heparin until INR therapeutic. Olga Millers

## 2011-01-29 NOTE — Interval H&P Note (Signed)
H and P reviewed; no changes. Edward Johnson

## 2011-01-29 NOTE — Anesthesia Preprocedure Evaluation (Addendum)
Anesthesia Evaluation  Patient identified by MRN, date of birth, ID band Patient awake    Reviewed: Allergy & Precautions, H&P , NPO status , Patient's Chart, lab work & pertinent test results, reviewed documented beta blocker date and time   Airway Mallampati: II      Dental  (+) Teeth Intact   Pulmonary shortness of breath,          Cardiovascular hypertension, + angina + dysrhythmias Atrial Fibrillation Irregular     Neuro/Psych    GI/Hepatic   Endo/Other  Morbid obesity  Renal/GU      Musculoskeletal   Abdominal (+) obese,   Peds  Hematology   Anesthesia Other Findings   Reproductive/Obstetrics                         Anesthesia Physical Anesthesia Plan  ASA: III  Anesthesia Plan: General   Post-op Pain Management:    Induction:   Airway Management Planned:   Additional Equipment:   Intra-op Plan:   Post-operative Plan:   Informed Consent: I have reviewed the patients History and Physical, chart, labs and discussed the procedure including the risks, benefits and alternatives for the proposed anesthesia with the patient or authorized representative who has indicated his/her understanding and acceptance.     Plan Discussed with: CRNA  Anesthesia Plan Comments:         Anesthesia Quick Evaluation

## 2011-01-29 NOTE — Progress Notes (Signed)
*  PRELIMINARY RESULTS* Echocardiogram Echocardiogram Transesophageal has been performed.  Clide Deutscher RDCS 01/29/2011, 11:02 AM

## 2011-01-29 NOTE — Progress Notes (Signed)
  Subjective:  The patient remains in atrial fibrillation.Rate is slow on IV ditiazem. 2D echo done yesterday but not yet read in computer. EKG today pending.  Patient feels better, minimal chest pressure now.  Enzymes neg.  Objective:  Vital Signs in the last 24 hours: Temp:  [97.7 F (36.5 C)] 97.7 F (36.5 C) (11/14 0600) Pulse Rate:  [76-118] 79  (11/14 0600) Resp:  [16-20] 18  (11/14 0600) BP: (105-167)/(76-89) 167/76 mmHg (11/14 0600) SpO2:  [95 %-100 %] 98 % (11/14 0600) Weight:  [325 lb (147.419 kg)] 325 lb (147.419 kg) (11/13 1345)  Intake/Output from previous day: 11/13 0701 - 11/14 0700 In: 360.6 [P.O.:240; I.V.:120.6] Out: -  Intake/Output from this shift:       . aspirin  324 mg Oral Once  . heparin  5,000 Units Intravenous Once  . sodium chloride  1,000 mL Intravenous Once  . sodium chloride  3 mL Intravenous Q12H  . DISCONTD: diltiazem  90 mg Oral Q12H  . DISCONTD: diltiazem      . DISCONTD: enoxaparin  40 mg Subcutaneous Q24H      . diltiazem (CARDIZEM) infusion 10 mg/hr (01/29/11 0544)  . heparin 2,050 Units/hr (01/29/11 0340)  . DISCONTD: diltiazem (CARDIZEM) infusion 5 mg/hr (01/28/11 1610)    Physical Exam: The patient appears to be in no distress.  Head and neck exam reveals that the pupils are equal and reactive.  The extraocular movements are full.  There is no scleral icterus.  Mouth and pharynx are benign.  No lymphadenopathy.  No carotid bruits.  The jugular venous pressure is normal.  Thyroid is not enlarged or tender.  Chest is clear to percussion and auscultation.  No rales or rhonchi.  Expansion of the chest is symmetrical.  Heart reveals no abnormal lift or heave.  First and second heart sounds are normal.  There is no murmur gallop rub or click.  The abdomen is soft and nontender.  Bowel sounds are normoactive.  There is no hepatosplenomegaly or mass.  There are no abdominal bruits.  Extremities reveal no phlebitis or edema.  Pedal  pulses are good.  There is no cyanosis or clubbing.  Neurologic exam is normal strength and no lateralizing weakness.  No sensory deficits.  Integument reveals no rash  Lab Results:  Basename 01/29/11 0602 01/28/11 0621  WBC 7.0 8.8  HGB 13.7 14.3  PLT 304 331    Basename 01/28/11 0621  NA 139  K 3.8  CL 101  CO2 30  GLUCOSE 169*  BUN 10  CREATININE 1.03    Basename 01/29/11 0605 01/28/11 2035  TROPONINI <0.30 <0.30   Hepatic Function Panel No results found for this basename: PROT,ALBUMIN,AST,ALT,ALKPHOS,BILITOT,BILIDIR,IBILI in the last 72 hours  Basename 01/29/11 0602  CHOL 193   No results found for this basename: PROTIME in the last 72 hours  Imaging: Imaging results have been reviewed.  2D echo results not yet in computer.  Cardiac Studies:  Assessment/Plan:  Patient Active Problem List  Diagnoses  .   Atrial fibrillation with rapid ventricular response Plan:  TEE cardioversion today. Decrease cardizem to 5 mg/hr  . History of noncompliance with medical treatment  . Benign essential hypertension  . Hyperglycemia    LOS: 1 day    Cassell Clement 01/29/2011, 7:39 AM

## 2011-01-29 NOTE — Telephone Encounter (Signed)
I spoke with Dr. Manson Passey about the patient.  The patient will be followed in the medical clinic.  He will have health insurance after 90 days on his present job and at that point he will come back to see [place] regarding his recurrent atrial fibrillation history.  He will be discharged on aspirin.

## 2011-01-29 NOTE — Telephone Encounter (Signed)
Copy of dismissal letter in your folder, please advise

## 2011-02-10 ENCOUNTER — Encounter (HOSPITAL_COMMUNITY): Payer: Self-pay | Admitting: Cardiology

## 2011-02-14 ENCOUNTER — Encounter: Payer: Self-pay | Admitting: Internal Medicine

## 2012-02-24 ENCOUNTER — Encounter (HOSPITAL_COMMUNITY): Payer: Self-pay | Admitting: Emergency Medicine

## 2012-02-24 ENCOUNTER — Emergency Department (HOSPITAL_COMMUNITY)
Admission: EM | Admit: 2012-02-24 | Discharge: 2012-02-24 | Disposition: A | Discharge status: Home / Self Care | Payer: Self-pay | Attending: Emergency Medicine | Admitting: Emergency Medicine

## 2012-02-24 DIAGNOSIS — Z9119 Patient's noncompliance with other medical treatment and regimen: Secondary | ICD-10-CM | POA: Insufficient documentation

## 2012-02-24 DIAGNOSIS — IMO0001 Reserved for inherently not codable concepts without codable children: Secondary | ICD-10-CM | POA: Insufficient documentation

## 2012-02-24 DIAGNOSIS — I209 Angina pectoris, unspecified: Secondary | ICD-10-CM | POA: Insufficient documentation

## 2012-02-24 DIAGNOSIS — R0602 Shortness of breath: Secondary | ICD-10-CM | POA: Insufficient documentation

## 2012-02-24 DIAGNOSIS — R5381 Other malaise: Secondary | ICD-10-CM | POA: Insufficient documentation

## 2012-02-24 DIAGNOSIS — R5383 Other fatigue: Secondary | ICD-10-CM | POA: Insufficient documentation

## 2012-02-24 DIAGNOSIS — I4891 Unspecified atrial fibrillation: Secondary | ICD-10-CM | POA: Insufficient documentation

## 2012-02-24 DIAGNOSIS — Z91199 Patient's noncompliance with other medical treatment and regimen due to unspecified reason: Secondary | ICD-10-CM | POA: Insufficient documentation

## 2012-02-24 DIAGNOSIS — Z79899 Other long term (current) drug therapy: Secondary | ICD-10-CM | POA: Insufficient documentation

## 2012-02-24 DIAGNOSIS — Z7982 Long term (current) use of aspirin: Secondary | ICD-10-CM | POA: Insufficient documentation

## 2012-02-24 LAB — PROTIME-INR
INR: 1.02 (ref 0.00–1.49)
Prothrombin Time: 13.3 seconds (ref 11.6–15.2)

## 2012-02-24 LAB — POCT I-STAT, CHEM 8
BUN: 12 mg/dL (ref 6–23)
Calcium, Ion: 1.07 mmol/L — ABNORMAL LOW (ref 1.12–1.23)
Chloride: 106 mEq/L (ref 96–112)
Creatinine, Ser: 1.1 mg/dL (ref 0.50–1.35)
Glucose, Bld: 108 mg/dL — ABNORMAL HIGH (ref 70–99)
HCT: 39 % (ref 39.0–52.0)
Hemoglobin: 13.3 g/dL (ref 13.0–17.0)
Potassium: 3.9 mEq/L (ref 3.5–5.1)
Sodium: 141 mEq/L (ref 135–145)
TCO2: 26 mmol/L (ref 0–100)

## 2012-02-24 LAB — TROPONIN I: Troponin I: <0.3 ng/mL (ref <0.30)

## 2012-02-24 MED ORDER — WARFARIN - PHYSICIAN DOSING INPATIENT
Freq: Every day | Status: DC
  Administered 2012-02-24: 05:00:00

## 2012-02-24 MED ORDER — WARFARIN SODIUM 5 MG PO TABS: 5.0000 mg | ORAL_TABLET | Freq: Every day | ORAL | Status: DC

## 2012-02-24 MED ORDER — WARFARIN SODIUM 5 MG PO TABS
5.0000 mg | ORAL_TABLET | Freq: Once | ORAL | Status: AC
  Administered 2012-02-24: 5 mg via ORAL
  Filled 2012-02-24: qty 1

## 2012-02-24 NOTE — ED Provider Notes (Signed)
History     CSN: [id number]  Arrival date & time 02/24/12  0113   First MD Initiated Contact with Patient 02/24/12 0201      Chief Complaint  Patient presents with  . Palpitations    (Consider location/radiation/quality/duration/timing/severity/associated sxs/prior treatment) Patient is a 41 y.o. male presenting with palpitations. The history is provided by the patient.  Palpitations  This is a recurrent problem. The current episode started more than 1 week ago. The problem occurs constantly. The problem has not changed since onset.The problem is associated with anti-arrhythmics. Associated symptoms include malaise/fatigue, irregular heartbeat, weakness and shortness of breath. Pertinent negatives include no fever, no chest pain, no chest pressure, no exertional chest pressure, no near-syncope, no syncope, no abdominal pain, no nausea and no leg pain. He has tried nothing (tried taking his cardizem without improvement) for the symptoms. The treatment provided no relief. Past medical history comments: hx of paroxsymal a.fib.    Past Medical History  Diagnosis Date  . Atrial fibrillation     history of paroxysmal afib, s/p TEE cardioversion (2005, 2007, 2012), and chemical cardioversion with flecainide (2005, 2011)  . History of noncompliance with medical treatment   . Borderline blood pressure     borderline BP's during acute hospitalizations, no data from outpatient setting  . Angina   . Shortness of breath     Past Surgical History  Procedure Date  . Cardioversion 01/29/2011    Procedure: CARDIOVERSION;  Surgeon: Lewayne Bunting, MD;  Location: [facility] ENDOSCOPY;  Service: Cardiovascular;  Laterality: N/A;  . Tee without cardioversion 01/29/2011    Procedure: TRANSESOPHAGEAL ECHOCARDIOGRAM (TEE);  Surgeon: Lewayne Bunting, MD;  Location: [facility] ENDOSCOPY;  Service: Cardiovascular;  Laterality: N/A;    No family history on file.  History  Substance Use Topics  . Smoking status:  Never Smoker   . Smokeless tobacco: Never Used  . Alcohol Use: 0.0 oz/week    3-4 Cans of beer per week     Comment: OCCASIONAL      Review of Systems  Constitutional: Positive for malaise/fatigue. Negative for fever.  Respiratory: Positive for shortness of breath.   Cardiovascular: Positive for palpitations. Negative for chest pain, syncope and near-syncope.  Gastrointestinal: Negative for nausea and abdominal pain.  Neurological: Positive for weakness.  All other systems reviewed and are negative.    Allergies  Review of patient's allergies indicates no known allergies.  Home Medications   Current Outpatient Rx  Name  Route  Sig  Dispense  Refill  . ASPIRIN 325 MG PO TABS   Oral   Take 325 mg by mouth daily.         Marland Kitchen CARDIZEM PO   Oral   Take 1 capsule by mouth daily.           BP 135/83  Pulse 87  Temp 98.2 F (36.8 C) (Oral)  Resp 20  SpO2 95%  Physical Exam  Nursing note and vitals reviewed. Constitutional: He is oriented to person, place, and time. He appears well-developed and well-nourished. No distress.  HENT:  Head: Normocephalic and atraumatic.  Mouth/Throat: Oropharynx is clear and moist.  Eyes: Conjunctivae normal and EOM are normal. Pupils are equal, round, and reactive to light.  Neck: Normal range of motion. Neck supple.  Cardiovascular: Normal rate and intact distal pulses.  An irregularly irregular rhythm present.  No murmur heard. Pulmonary/Chest: Effort normal and breath sounds normal. No respiratory distress. He has no wheezes. He has no rales.  Abdominal: Soft. He exhibits no distension. There is no tenderness. There is no rebound and no guarding.  Musculoskeletal: Normal range of motion. He exhibits no edema and no tenderness.  Neurological: He is alert and oriented to person, place, and time.  Skin: Skin is warm and dry. No rash noted. No erythema.  Psychiatric: He has a normal mood and affect. His behavior is normal.    ED  Course  Procedures (including critical care time)  Labs Reviewed  POCT I-STAT, CHEM 8 - Abnormal; Notable for the following:    Glucose, Bld 108 (*)     Calcium, Ion 1.07 (*)     All other components within normal limits  PROTIME-INR  TROPONIN I   No results found.   Date: 02/24/2012  Rate: 99  Rhythm: atrial fibrillation  QRS Axis: normal  Intervals: normal  ST/T Wave abnormalities: nonspecific T wave changes  Conduction Disutrbances:none  Narrative Interpretation:   Old EKG Reviewed: unchanged   1. Atrial fibrillation       MDM   Patient with a history of A. fib status post cardioversion approximately one year ago. He states around Thanksgiving he started to feel irregular heart rate, weakness, fatigue and occasional shortness of breath with exertion. He denies any chest pain, diaphoresis. He is well-appearing on exam but is in atrial fibrillation today with a controlled rate less than 100. He states he still taking his Cardizem daily as prescribed but is not helping. He is also taking aspirin. Given an unknown time frame for the atrial fibrillation he is not a candidate for cardioversion. Will ensure normal electrolytes and INR. We'll most likely start patient on Coumadin, will speak with cardiology about outpatient followup but currently is stable today with no signs of distress  3:06 AM Spoke with [place] cards and they will f/u with him tomorrow.      Gwyneth Sprout, MD 02/24/12 [phone]

## 2012-02-24 NOTE — ED Notes (Signed)
PT. REPORTS PALPITATIONS / CHEST DISCOMFORT FOR 2 DAYS , DENIES CHEST PAIN , SLIGHT SOB WITH OCCASIONAL DRY COUGH , STATES HISTORY OF ATRIAL FIBRILLATION / TAKING DILTIAZEM AND ASA DAILY.

## 2013-07-27 ENCOUNTER — Emergency Department (HOSPITAL_COMMUNITY): Payer: 59

## 2013-07-27 ENCOUNTER — Inpatient Hospital Stay (HOSPITAL_COMMUNITY)
Admission: EM | Admit: 2013-07-27 | Discharge: 2013-07-28 | DRG: 310 | Disposition: A | Discharge status: Home / Self Care | Payer: 59 | Attending: Cardiovascular Disease | Admitting: Cardiovascular Disease

## 2013-07-27 ENCOUNTER — Encounter (HOSPITAL_COMMUNITY): Payer: Self-pay | Admitting: Emergency Medicine

## 2013-07-27 DIAGNOSIS — I059 Rheumatic mitral valve disease, unspecified: Secondary | ICD-10-CM

## 2013-07-27 DIAGNOSIS — I4891 Unspecified atrial fibrillation: Principal | ICD-10-CM | POA: Diagnosis present

## 2013-07-27 DIAGNOSIS — Z91199 Patient's noncompliance with other medical treatment and regimen due to unspecified reason: Secondary | ICD-10-CM

## 2013-07-27 DIAGNOSIS — Z9119 Patient's noncompliance with other medical treatment and regimen: Secondary | ICD-10-CM

## 2013-07-27 DIAGNOSIS — R030 Elevated blood-pressure reading, without diagnosis of hypertension: Secondary | ICD-10-CM

## 2013-07-27 DIAGNOSIS — I509 Heart failure, unspecified: Secondary | ICD-10-CM | POA: Diagnosis present

## 2013-07-27 DIAGNOSIS — I10 Essential (primary) hypertension: Secondary | ICD-10-CM

## 2013-07-27 HISTORY — DX: Morbid (severe) obesity due to excess calories: E66.01

## 2013-07-27 LAB — BASIC METABOLIC PANEL
BUN: 13 mg/dL (ref 6–23)
CO2: 22 mEq/L (ref 19–32)
Calcium: 9.1 mg/dL (ref 8.4–10.5)
Chloride: 106 mEq/L (ref 96–112)
Creatinine, Ser: 1.13 mg/dL (ref 0.50–1.35)
GFR calc Af Amer: >90 mL/min (ref >90)
GFR calc non Af Amer: 79 mL/min — ABNORMAL LOW (ref >90)
Glucose, Bld: 112 mg/dL — ABNORMAL HIGH (ref 70–99)
Potassium: 3.8 mEq/L (ref 3.7–5.3)
Sodium: 141 mEq/L (ref 137–147)

## 2013-07-27 LAB — T4, FREE: Free T4: 0.74 ng/dL — ABNORMAL LOW (ref 0.80–1.80)

## 2013-07-27 LAB — TSH: TSH: 2.88 u[IU]/mL (ref 0.350–4.500)

## 2013-07-27 LAB — CBC
HCT: 36 % — ABNORMAL LOW (ref 39.0–52.0)
Hemoglobin: 11.9 g/dL — ABNORMAL LOW (ref 13.0–17.0)
MCH: 32.2 pg (ref 26.0–34.0)
MCHC: 33.1 g/dL (ref 30.0–36.0)
MCV: 97.3 fL (ref 78.0–100.0)
Platelets: 224 10*3/uL (ref 150–400)
RBC: 3.7 MIL/uL — ABNORMAL LOW (ref 4.22–5.81)
RDW: 11.9 % (ref 11.5–15.5)
WBC: 9.3 10*3/uL (ref 4.0–10.5)

## 2013-07-27 LAB — PRO B NATRIURETIC PEPTIDE: Pro B Natriuretic peptide (BNP): 690.8 pg/mL — ABNORMAL HIGH (ref 0–125)

## 2013-07-27 LAB — TROPONIN I: Troponin I: <0.3 ng/mL (ref <0.30)

## 2013-07-27 MED ORDER — TRAMADOL HCL 50 MG PO TABS
50.0000 mg | ORAL_TABLET | Freq: Four times a day (QID) | ORAL | Status: DC | PRN
  Administered 2013-07-28: 50 mg via ORAL
  Filled 2013-07-27: qty 1

## 2013-07-27 MED ORDER — DILTIAZEM LOAD VIA INFUSION
15.0000 mg | Freq: Once | INTRAVENOUS | Status: AC
  Administered 2013-07-27: 15 mg via INTRAVENOUS
  Filled 2013-07-27: qty 15

## 2013-07-27 MED ORDER — SODIUM CHLORIDE 0.9 % IJ SOLN
3.0000 mL | Freq: Two times a day (BID) | INTRAMUSCULAR | Status: DC
  Administered 2013-07-27: 3 mL via INTRAVENOUS

## 2013-07-27 MED ORDER — RIVAROXABAN 20 MG PO TABS
20.0000 mg | ORAL_TABLET | Freq: Every day | ORAL | Status: DC
  Administered 2013-07-27: 20 mg via ORAL
  Filled 2013-07-27 (×2): qty 1

## 2013-07-27 MED ORDER — ASPIRIN 325 MG PO TABS
325.0000 mg | ORAL_TABLET | ORAL | Status: AC
  Administered 2013-07-27: 325 mg via ORAL
  Filled 2013-07-27: qty 1

## 2013-07-27 MED ORDER — ASPIRIN EC 81 MG PO TBEC
81.0000 mg | DELAYED_RELEASE_TABLET | Freq: Every day | ORAL | Status: DC
  Administered 2013-07-27: 81 mg via ORAL
  Filled 2013-07-27 (×2): qty 1

## 2013-07-27 MED ORDER — ACETAMINOPHEN 325 MG PO TABS: 650.0000 mg | ORAL_TABLET | ORAL | Status: DC | PRN

## 2013-07-27 MED ORDER — NITROGLYCERIN 0.4 MG SL SUBL
0.4000 mg | SUBLINGUAL_TABLET | SUBLINGUAL | Status: DC | PRN
  Administered 2013-07-27 (×2): 0.4 mg via SUBLINGUAL

## 2013-07-27 MED ORDER — DILTIAZEM HCL 100 MG IV SOLR
5.0000 mg/h | INTRAVENOUS | Status: DC
  Administered 2013-07-27 (×2): 5 mg/h via INTRAVENOUS
  Administered 2013-07-27 – 2013-07-28 (×2): 10 mg/h via INTRAVENOUS
  Filled 2013-07-27 (×2): qty 100

## 2013-07-27 MED ORDER — SODIUM CHLORIDE 0.9 % IJ SOLN: 3.0000 mL | INTRAMUSCULAR | Status: DC | PRN

## 2013-07-27 MED ORDER — DILTIAZEM HCL 25 MG/5ML IV SOLN: 10.0000 mg | Freq: Once | INTRAVENOUS | Status: DC

## 2013-07-27 MED ORDER — SODIUM CHLORIDE 0.9 % IV SOLN: INTRAVENOUS | Status: DC

## 2013-07-27 MED ORDER — SODIUM CHLORIDE 0.9 % IV SOLN: 250.0000 mL | INTRAVENOUS | Status: DC | PRN

## 2013-07-27 NOTE — ED Notes (Signed)
Pt presents with afib with RVR x 2 days. Had same in 2013 and was put on cardizem. States that he stopped taking medicine because he felt better. Takes ASA but not on a daily basis. Did not take any yesterday. States he started experiencing chest tightness, sharp, stabbing chest pain in left chest a couple of days ago. Has not seen a PCP or Cardiologist in a couple of years.

## 2013-07-27 NOTE — ED Notes (Signed)
Heart healthy tray ordered 

## 2013-07-27 NOTE — Care Management Note (Addendum)
  Page 2 of 2   07/28/2013     4:05:02 PM CARE MANAGEMENT NOTE 07/28/2013  Patient:  Edward NeitherGAINEY,Bharat   Account Number:  [id number]  Date Initiated:  07/27/2013  Documentation initiated by:  Donato SchultzHUTCHINSON,CRYSTAL  Subjective/Objective Assessment:   Admitted with afib     Action/Plan:   CM to follow for disposition needs   Anticipated DC Date:  07/30/2013   Anticipated DC Plan:  HOME/SELF CARE      DC Planning Services  CM consult  Follow-up appt scheduled  Medication Assistance      Choice offered to / List presented to:             Status of service:  Completed, signed off Medicare Important Message given?   (If response is "NO", the following Medicare IM given date fields will be blank) Date Medicare IM given:   Date Additional Medicare IM given:    Discharge Disposition:  HOME/SELF CARE  Per UR Regulation:  Reviewed for med. necessity/level of care/duration of stay  If discussed at Long Length of Stay Meetings, dates discussed:    Comments:  Crystal Hutchinson RN, BSN, MSHL, CCM  Nurse - Case Manager, ([facility])  [phone]  07/28/2013 Xarelto ---07/28/2013 1423 by Donato SchultzRYSTAL HUTCHINSON--- rivaroxaban (XARELTO) tablet 20 mg  :  Dose 20 mg  :  Oral :  Daily with supper Please check benefits for coverage, copays, deductibles, authorizations and preferred pharmacy. PT COPAY WILL BE $30-NO PRIOR AUTH REQUIRED CM provided patient with 30 day free activation card and [employer] savings card.  Wife activated prior to d/c today. CM advised if co-pays become an issue; please notify MD office so MD office may provide possible resource asssitance. CM encouraged compliance with taking as Rx'd without missing any doses. CM called patient at home [phone] to notify of coverage  PCP:  None [facility] Follow up with [facility] On 08/02/2013. (PCP appointment May 19th at 2:30 pm ) [address] [place]  [phone] CM also instructed in how to contact [facility] # on provider card to request PCPs in network and geographic area. CM advised [facility] appt will help to transition until other PCP can be established if desired.    Crystal Hutchinson RN, BSN, MSHL, CCM  Nurse - Case Manager, ([facility])  [phone]  07/27/2013 diltiazem (CARDIZEM) 100 mg in dextrose 5 % 100 mL infusion :  Dose 5-15 mg/hr   Intravenous  :  Continuous patient started on Xarelto Disposition pending

## 2013-07-27 NOTE — Progress Notes (Signed)
UR completed Crystal K. Hutchinson, RN, BSN, MSHL, CCM  07/27/2013 5:45 PM

## 2013-07-27 NOTE — Progress Notes (Signed)
*  PRELIMINARY RESULTS* Echocardiogram 2D Echocardiogram has been performed.  Edward PullerJohanna R Johnson 07/27/2013, 4:45 PM

## 2013-07-27 NOTE — ED Provider Notes (Signed)
CSN: [id number]     Arrival date & time 07/27/13  0123 History   First MD Initiated Contact with Patient 07/27/13 0135     Chief Complaint  Patient presents with  . Palpitations     HPI Patient presents with complaints of palpitations and generalized weakness over the past several days.  He has a history of paroxysmal atrial fibrillation.  He's been cardioverted before in the past.  His symptoms began Sunday evening.  He now presents complaining of ongoing palpitations.  He currently is not taking Cardizem.  He continues to drink caffeine.  No recent illness, fevers, chills.  Denies chest pain   Past Medical History  Diagnosis Date  . Atrial fibrillation     history of paroxysmal afib, s/p TEE cardioversion (2005, 2007, 2012), and chemical cardioversion with flecainide (2005, 2011)  . History of noncompliance with medical treatment   . Borderline blood pressure     borderline BP's during acute hospitalizations, no data from outpatient setting  . Angina   . Shortness of breath    Past Surgical History  Procedure Laterality Date  . Cardioversion  01/29/2011    Procedure: CARDIOVERSION;  Surgeon: Lewayne BuntingBrian S Crenshaw, MD;  [facility];  Service: Cardiovascular;  Laterality: N/A;  . Tee without cardioversion  01/29/2011    Procedure: TRANSESOPHAGEAL ECHOCARDIOGRAM (TEE);  Surgeon: Lewayne BuntingBrian S Crenshaw, MD;  [facility];  Service: Cardiovascular;  Laterality: N/A;   History reviewed. No pertinent family history. History  Substance Use Topics  . Smoking status: Never Smoker   . Smokeless tobacco: Never Used  . Alcohol Use: 0.0 oz/week    3-4 Cans of beer per week     Comment: OCCASIONAL    Review of Systems  All other systems reviewed and are negative.     Allergies  Review of patient's allergies indicates no known allergies.  Home Medications   Prior to Admission medications   Medication Sig Start Date End Date Taking? Authorizing Provider  aspirin 325 MG  tablet Take 325 mg by mouth every 6 (six) hours as needed for mild pain.    Yes Historical Provider, MD   BP 123/86  Pulse 86  Temp(Src) 98.7 F (37.1 C) (Oral)  Resp 14  Ht 6\' 6"  (1.981 m)  Wt 371 lb 8 oz (168.511 kg)  BMI 42.94 kg/m2  SpO2 97% Physical Exam  Nursing note and vitals reviewed. Constitutional: He is oriented to person, place, and time. He appears well-developed and well-nourished.  HENT:  Head: Normocephalic and atraumatic.  Eyes: EOM are normal.  Neck: Normal range of motion.  Cardiovascular: Normal heart sounds and intact distal pulses.   Tachycardia.  Irregularly irregular  Pulmonary/Chest: Effort normal and breath sounds normal. No respiratory distress.  Abdominal: Soft. He exhibits no distension. There is no tenderness.  Musculoskeletal: Normal range of motion.  Neurological: He is alert and oriented to person, place, and time.  Skin: Skin is warm and dry.  Psychiatric: He has a normal mood and affect. Judgment normal.    ED Course  Procedures (including critical care time) Labs Review Labs Reviewed  CBC - Abnormal; Notable for the following:    RBC 3.70 (*)    Hemoglobin 11.9 (*)    HCT 36.0 (*)    All other components within normal limits  BASIC METABOLIC PANEL - Abnormal; Notable for the following:    Glucose, Bld 112 (*)    GFR calc non Af Amer 79 (*)    All  other components within normal limits  PRO B NATRIURETIC PEPTIDE - Abnormal; Notable for the following:    Pro B Natriuretic peptide (BNP) 690.8 (*)    All other components within normal limits  TROPONIN I  Rosezena SensorI-STAT TROPOININ, ED    Imaging Review Dg Chest Port 1 View  07/27/2013   CLINICAL DATA:  Chest pain  EXAM: PORTABLE CHEST - 1 VIEW  COMPARISON:  01/28/2011  FINDINGS: Cardiac shadow is enlarged. The lungs are clear. No focal infiltrate is noted. The bony structures are within normal limits.  IMPRESSION: Cardiomegaly without acute abnormality.   Electronically Signed   By: Alcide CleverMark  Lukens  M.D.   On: 07/27/2013 02:23  I personally reviewed the imaging tests through PACS system I reviewed available ER/hospitalization records through the EMR    EKG Interpretation   Date/Time:  Wednesday Jul 27 2013 01:25:00 EDT Ventricular Rate:  126 PR Interval:    QRS Duration: 84 QT Interval:  330 QTC Calculation: 477 R Axis:   39 Text Interpretation:  Atrial fibrillation with rapid ventricular response  with premature ventricular or aberrantly conducted complexes Nonspecific T  wave abnormality , probably digitalis effect Abnormal ECG No significant  change was found Confirmed by CAMPOS  MD, KEVIN ([id number]) on 07/27/2013  2:28:04 AM      MDM   Final diagnoses:  Atrial fibrillation with rapid ventricular response    5:37 AM Atrial fibrillation with rapid ventricular response.  Patient is now on a Cardizem drip.  His rate is down to 80 but he remains in atrial fibrillation.  Patient on admission the hospital likely TEE with cardioversion again.  Cardiology consult.     Lyanne CoKevin M Campos, MD 07/27/13 [phone]

## 2013-07-27 NOTE — ED Notes (Signed)
Lunch tray ordered 

## 2013-07-27 NOTE — H&P (Signed)
Patient ID: Edward Johnson MRN: [id number] DOB/AGE: 42/11/1970 42 y.o. Admit date: 07/27/2013  Primary Care Physician: None Primary Cardiologist: Edward Johnson  HPI: 43 yo male with long history of paroxysmal atrial fibrillation with cardioversions 2005, 2007, 2012, HTN, medical non-compliance presented to ED with c/o palpitations and found to be in atrial fibrillation with RVR. He has been started in IV Cardizem and is rate controlled. He has a long history of PAF with multiple admission over the years for cardioversion but he has been non-compliant with medical therapy and has not followed up in our office as scheduled. He tells me that he was feeling well until Sunday Jul 24, 2013 when he began to feel dyspnea, chest pressure and palpitations. Presented to ED this morning. Only taking ASA at home. States that he has been non-compliant with medical therapy and cardiology follow up because of lack of insurance.   Review of systems complete and found to be negative unless listed above   Past Medical History  Diagnosis Date  . Atrial fibrillation     history of paroxysmal afib, s/p TEE cardioversion (2005, 2007, 2012), and chemical cardioversion with flecainide (2005, 2011)  . History of noncompliance with medical treatment   . Borderline blood pressure     borderline BP's during acute hospitalizations, no data from outpatient setting  . Angina   . Shortness of breath     Family History  Problem Relation Age of Onset  . Cancer - Other Mother     Uterine  . CAD Neg Hx     History   Social History  . Marital Status: Single    Spouse Name: N/A    Number of Children: N/A  . Years of Education: N/A   Occupational History  . Not on file.   Social History Main Topics  . Smoking status: Never Smoker   . Smokeless tobacco: Never Used  . Alcohol Use: No     Comment: OCCASIONAL  . Drug Use: No  . Sexual Activity: Not on file   Other Topics Concern  . Not on file   Social History  Narrative  . No narrative on file    Past Surgical History  Procedure Laterality Date  . Cardioversion  01/29/2011    Procedure: CARDIOVERSION;  Surgeon: Edward BuntingBrian S Crenshaw, MD;  [facility];  Service: Cardiovascular;  Laterality: N/A;  . Tee without cardioversion  01/29/2011    Procedure: TRANSESOPHAGEAL ECHOCARDIOGRAM (TEE);  Surgeon: Edward BuntingBrian S Crenshaw, MD;  [facility];  Service: Cardiovascular;  Laterality: N/A;    No Known Allergies  Prior to Admission Meds:  ASA 325 mg po Qdaily  Physical Exam: Blood pressure 120/79, pulse 99, temperature 98.7 F (37.1 C), temperature source Oral, resp. rate 14, height 6\' 6"  (1.981 m), weight 371 lb 8 oz (168.511 kg), SpO2 99.00%.    General: Well developed, well nourished, NAD  HEENT: OP clear, mucus membranes moist  SKIN: warm, dry. No rashes.  Neuro: No focal deficits  Musculoskeletal: Muscle strength 5/5 all ext  Psychiatric: Mood and affect normal  Neck: No JVD, no carotid bruits, no thyromegaly, no lymphadenopathy.  Lungs:Clear bilaterally, no wheezes, rhonci, crackles  Cardiovascular: Irregular irregular. No murmurs, gallops or rubs.  Abdomen:Soft. Bowel sounds present. Non-tender.  Extremities: Trace bilateral lower extremity edema. Pulses are 2 + in the bilateral DP/PT.  Labs:   Lab Results  Component Value Date   WBC 9.3 07/27/2013   HGB 11.9* 07/27/2013   HCT 36.0*  07/27/2013   MCV 97.3 07/27/2013   PLT 224 07/27/2013     Recent Labs Lab 07/27/13 0155  NA 141  K 3.8  CL 106  CO2 22  BUN 13  CREATININE 1.13  CALCIUM 9.1  GLUCOSE 112*   Lab Results  Component Value Date             TROPONINI <0.30 07/27/2013      Radiology: Chest x-ray: Cardiomegaly without acute abnormality.  EKG: atrial fibrillation, rate 126 bpm.   ASSESSMENT AND PLAN:   1. Atrial fibrillation with RVR: He has a long history of atrial fib with RVR. Now rate controlled on Cardizem drip 5 mg/hour. Will admit to telemetry  unit. Continue cardizem drip. Will start Xarelto 20 mg daily for anti-coagulation. Echo today. Will make NPO at midnight for TEE guided cardioversion in am. Plan for long term therapy with Cardizem and Xarelto. He will need f/u in our office with Dr. Patty Johnson. He would also like to discuss atrial fibrillation ablation in the future.   2. HTN: BP controlled. Will follow.   Earney Hamburghris McAlhany, MD 07/27/2013, 7:52 AM

## 2013-07-27 NOTE — ED Notes (Signed)
Dr. Campos at bedside assessing patient.  

## 2013-07-27 NOTE — ED Notes (Signed)
Pt in c/o palpitations over the last two days, also shortness of breath with this, history of irregular heart beat in the past, states the symptoms have been constant since they started, denies pain, no distress noted

## 2013-07-28 ENCOUNTER — Inpatient Hospital Stay (HOSPITAL_COMMUNITY): Payer: 59 | Admitting: Anesthesiology

## 2013-07-28 ENCOUNTER — Encounter (HOSPITAL_COMMUNITY): Payer: 59 | Admitting: Anesthesiology

## 2013-07-28 ENCOUNTER — Encounter (HOSPITAL_COMMUNITY): Payer: Self-pay | Admitting: Anesthesiology

## 2013-07-28 ENCOUNTER — Encounter (HOSPITAL_COMMUNITY)
Admission: EM | Disposition: A | Discharge status: Home / Self Care | Payer: Self-pay | Source: Home / Self Care | Attending: Cardiovascular Disease

## 2013-07-28 DIAGNOSIS — I509 Heart failure, unspecified: Secondary | ICD-10-CM

## 2013-07-28 HISTORY — PX: CARDIOVERSION: SHX1299

## 2013-07-28 HISTORY — PX: TEE WITHOUT CARDIOVERSION: SHX5443

## 2013-07-28 LAB — CBC
HCT: 35.5 % — ABNORMAL LOW (ref 39.0–52.0)
Hemoglobin: 11.6 g/dL — ABNORMAL LOW (ref 13.0–17.0)
MCH: 31.8 pg (ref 26.0–34.0)
MCHC: 32.7 g/dL (ref 30.0–36.0)
MCV: 97.3 fL (ref 78.0–100.0)
Platelets: 262 10*3/uL (ref 150–400)
RBC: 3.65 MIL/uL — ABNORMAL LOW (ref 4.22–5.81)
RDW: 12.2 % (ref 11.5–15.5)
WBC: 9 10*3/uL (ref 4.0–10.5)

## 2013-07-28 LAB — BASIC METABOLIC PANEL
BUN: 12 mg/dL (ref 6–23)
CO2: 25 mEq/L (ref 19–32)
Calcium: 9 mg/dL (ref 8.4–10.5)
Chloride: 106 mEq/L (ref 96–112)
Creatinine, Ser: 0.97 mg/dL (ref 0.50–1.35)
GFR calc Af Amer: >90 mL/min (ref >90)
GFR calc non Af Amer: >90 mL/min (ref >90)
Glucose, Bld: 84 mg/dL (ref 70–99)
Potassium: 3.9 mEq/L (ref 3.7–5.3)
Sodium: 142 mEq/L (ref 137–147)

## 2013-07-28 LAB — HEMOGLOBIN A1C
Hgb A1c MFr Bld: 5.6 % (ref <5.7)
Mean Plasma Glucose: 114 mg/dL (ref <117)

## 2013-07-28 SURGERY — ECHOCARDIOGRAM, TRANSESOPHAGEAL
Anesthesia: Monitor Anesthesia Care

## 2013-07-28 MED ORDER — MIDAZOLAM HCL 5 MG/5ML IJ SOLN
INTRAMUSCULAR | Status: DC | PRN
  Administered 2013-07-28: 2 mg via INTRAVENOUS

## 2013-07-28 MED ORDER — RIVAROXABAN 20 MG PO TABS: 20.0000 mg | ORAL_TABLET | Freq: Every day | ORAL | Status: DC

## 2013-07-28 MED ORDER — METOPROLOL SUCCINATE ER 50 MG PO TB24
75.0000 mg | ORAL_TABLET | Freq: Every day | ORAL | Status: DC
  Administered 2013-07-28: 75 mg via ORAL
  Filled 2013-07-28: qty 1

## 2013-07-28 MED ORDER — RIVAROXABAN 20 MG PO TABS
20.0000 mg | ORAL_TABLET | Freq: Every day | ORAL | Status: DC
  Administered 2013-07-28: 20 mg via ORAL
  Filled 2013-07-28 (×2): qty 1

## 2013-07-28 MED ORDER — BUTAMBEN-TETRACAINE-BENZOCAINE 2-2-14 % EX AERO
INHALATION_SPRAY | CUTANEOUS | Status: DC | PRN
  Administered 2013-07-28: 2 via TOPICAL

## 2013-07-28 MED ORDER — PROPOFOL INFUSION 10 MG/ML OPTIME
INTRAVENOUS | Status: DC | PRN
  Administered 2013-07-28: 50 ug/kg/min via INTRAVENOUS

## 2013-07-28 MED ORDER — METOPROLOL SUCCINATE ER 25 MG PO TB24: 75.0000 mg | ORAL_TABLET | Freq: Every day | ORAL | Status: DC

## 2013-07-28 MED ORDER — SODIUM CHLORIDE 0.9 % IV SOLN
INTRAVENOUS | Status: DC | PRN
  Administered 2013-07-28: 09:00:00 via INTRAVENOUS

## 2013-07-28 MED ORDER — FENTANYL CITRATE 0.05 MG/ML IJ SOLN
INTRAMUSCULAR | Status: DC | PRN
  Administered 2013-07-28 (×2): 25 ug via INTRAVENOUS

## 2013-07-28 NOTE — Anesthesia Postprocedure Evaluation (Signed)
Anesthesia Post Note  Patient: Edward Johnson  Procedure(s) Performed: Procedure(s) (LRB): TRANSESOPHAGEAL ECHOCARDIOGRAM (TEE) (N/A) CARDIOVERSION (N/A)  Anesthesia type: MAC  Patient location: PACU  Post pain: Pain level controlled  Post assessment: Patient's Cardiovascular Status Stable  Last Vitals:  Filed Vitals:   07/28/13 1006  BP:   Pulse: 96  Temp: 36.8 C  Resp: 16    Post vital signs: Reviewed and stable  Level of consciousness: alert  Complications: No apparent anesthesia complications

## 2013-07-28 NOTE — Discharge Instructions (Addendum)
Information on my medicine - XARELTO (Rivaroxaban)  This medication education was reviewed with me or my healthcare representative as part of my discharge preparation.   Why was Xarelto prescribed for you? Xarelto was prescribed for you to reduce the risk of a blood clot forming that can cause a stroke if you have a medical condition called atrial fibrillation (a type of irregular heartbeat).  What do you need to know about xarelto ? Take your Xarelto ONCE DAILY at the same time every day with your evening meal. If you have difficulty swallowing the tablet whole, you may crush it and mix in applesauce just prior to taking your dose.  Take Xarelto exactly as prescribed by your doctor and DO NOT stop taking Xarelto without talking to the doctor who prescribed the medication.  Stopping without other stroke prevention medication to take the place of Xarelto may increase your risk of developing a clot that causes a stroke.  Refill your prescription before you run out.  After discharge, you should have regular check-up appointments with your healthcare provider that is prescribing your Xarelto.  In the future your dose may need to be changed if your kidney function or weight changes by a significant amount.  What do you do if you miss a dose? If you are taking Xarelto ONCE DAILY and you miss a dose, take it as soon as you remember on the same day then continue your regularly scheduled once daily regimen the next day. Do not take two doses of Xarelto at the same time or on the same day.   Important Safety Information A possible side effect of Xarelto is bleeding. You should call your healthcare provider right away if you experience any of the following:   Bleeding from an injury or your nose that does not stop.   Unusual colored urine (red or dark brown) or unusual colored stools (red or black).   Unusual bruising for unknown reasons.   A serious fall or if you hit your head (even if there  is no bleeding).  Some medicines may interact with Xarelto and might increase your risk of bleeding while on Xarelto. To help avoid this, consult your healthcare provider or pharmacist prior to using any new prescription or non-prescription medications, including herbals, vitamins, non-steroidal anti-inflammatory drugs (NSAIDs) and supplements.  This website has more information on Xarelto: [email or web address].  Atrial Fibrillation Atrial fibrillation is a type of irregular heart rhythm (arrhythmia). During atrial fibrillation, the upper chambers of the heart (atria) quiver continuously in a chaotic pattern. This causes an irregular and often rapid heart rate.  Atrial fibrillation is the result of the heart becoming overloaded with disorganized signals that tell it to beat. These signals are normally released one at a time by a part of the right atrium called the sinoatrial node. They then travel from the atria to the lower chambers of the heart (ventricles), causing the atria and ventricles to contract and pump blood as they pass. In atrial fibrillation, parts of the atria outside of the sinoatrial node also release these signals. This results in two problems. First, the atria receive so many signals that they do not have time to fully contract. Second, the ventricles, which can only receive one signal at a time, beat irregularly and out of rhythm with the atria.  There are three types of atrial fibrillation:   Paroxysmal Paroxysmal atrial fibrillation starts suddenly and stops on its own within a week.   Persistent Persistent atrial fibrillation lasts for  more than a week. It may stop on its own or with treatment.   Permanent Permanent atrial fibrillation does not go away. Episodes of atrial fibrillation may lead to permanent atrial fibrillation.  Atrial fibrillation can prevent your heart from pumping blood normally. It increases your risk of stroke and can lead to heart failure.  CAUSES    Heart conditions, including a heart attack, heart failure, coronary artery disease, and heart valve conditions.   Inflammation of the sac that surrounds the heart (pericarditis).   Blockage of an artery in the lungs (pulmonary embolism).   Pneumonia or other infections.   Chronic lung disease.   Thyroid problems, especially if the thyroid is overactive (hyperthyroidism).   Caffeine, excessive alcohol use, and use of some illegal drugs.   Use of some medications, including certain decongestants and diet pills.   Heart surgery.   Birth defects.  Sometimes, no cause can be found. When this happens, the atrial fibrillation is called lone atrial fibrillation. The risk of complications from atrial fibrillation increases if you have lone atrial fibrillation and you are age 42 years or older. RISK FACTORS  Heart failure.  Coronary artery disease  Diabetes mellitus.   High blood pressure (hypertension).   Obesity.   Other arrhythmias.   Increased age. SYMPTOMS   A feeling that your heart is beating rapidly or irregularly.   A feeling of discomfort or pain in your chest.   Shortness of breath.   Sudden lightheadedness or weakness.   Getting tired easily when exercising.   Urinating more often than normal (mainly when atrial fibrillation first begins).  In paroxysmal atrial fibrillation, symptoms may start and suddenly stop. DIAGNOSIS  Your caregiver may be able to detect atrial fibrillation when taking your pulse. Usually, testing is needed to diagnosis atrial fibrillation. Tests may include:   Electrocardiography. During this test, the electrical impulses of your heart are recorded while you are lying down.   Echocardiography. During echocardiography, sound waves are used to evaluate how blood flows through your heart.   Stress test. There is more than one type of stress test. If a stress test is needed, ask your caregiver about which type is  best for you.   Chest X-ray exam.   Blood tests.   Computed tomography (CT).  TREATMENT   Treating any underlying conditions. For example, if you have an overactive thyroid, treating the condition may correct atrial fibrillation.   Medication. Medications may be given to control a rapid heart rate or to prevent blood clots, heart failure, or a stroke.   Procedure to correct the rhythm of the heart:  Electrical cardioversion. During electrical cardioversion, a controlled, low-energy shock is delivered to the heart through your skin. If you have chest pain, very low pressure blood pressure, or sudden heart failure, this procedure may need to be done as an emergency.  Catheter ablation. During this procedure, heart tissues that send the signals that cause atrial fibrillation are destroyed.  Maze or minimaze procedure. During this surgery, thin lines of heart tissue that carry the abnormal signals are destroyed. The maze procedure is an open-heart surgery. The minimaze procedure is a minimally invasive surgery. This means that small cuts are made to access the heart instead of a large opening.  Pulmonary venous isolation. During this surgery, tissue around the veins that carry blood from the lungs (pulmonary veins) is destroyed. This tissue is thought to carry the abnormal signals. HOME CARE INSTRUCTIONS   Take medications as directed by  your caregiver.  Only take medications that your caregiver approves. Some medications can make atrial fibrillation worse or recur.  If blood thinners were prescribed by your caregiver, take them exactly as directed. Too much can cause bleeding. Too little and you will not have the needed protection against stroke and other problems.  Perform blood tests at home if directed by your caregiver.  Perform blood tests exactly as directed.   Quit smoking if you smoke.   Do not drink alcohol.   Do not drink caffeinated beverages such as coffee,  soda, and some teas. You may drink decaffeinated coffee, soda, or tea.   Maintain a healthy weight. Do not use diet pills unless your caregiver approves. They may make heart problems worse.   Follow diet instructions as directed by your caregiver.   Exercise regularly as directed by your caregiver.   Keep all follow-up appointments. PREVENTION  The following substances can cause atrial fibrillation to recur:   Caffeinated beverages.   Alcohol.   Certain medications, especially those used for breathing problems.   Certain herbs and herbal medications, such as those containing ephedra or ginseng.  Illegal drugs such as cocaine and amphetamines. Sometimes medications are given to prevent atrial fibrillation from recurring. Proper treatment of any underlying condition is also important in helping prevent recurrence.  SEEK MEDICAL CARE IF:  You notice a change in the rate, rhythm, or strength of your heartbeat.   You suddenly begin urinating more frequently.   You tire more easily when exerting yourself or exercising.  SEEK IMMEDIATE MEDICAL CARE IF:   You develop chest pain, abdominal pain, sweating, or weakness.  You feel sick to your stomach (nauseous).  You develop shortness of breath.  You suddenly develop swollen feet and ankles.  You feel dizzy.  You face or limbs feel numb or weak.  There is a change in your vision or speech. MAKE SURE YOU:   Understand these instructions.  Will watch your condition.  Will get help right away if you are not doing well or get worse. Document Released: 03/03/2005 Document Revised: 06/28/2012 Document Reviewed: 04/13/2012 [facility] Patient Information 2014 [place], [place].

## 2013-07-28 NOTE — Op Note (Signed)
INDICATIONS: atrial fibrillation precardioversion  PROCEDURE:   Informed consent was obtained prior to the procedure. The risks, benefits and alternatives for the procedure were discussed and the patient comprehended these risks.  Risks include, but are not limited to, cough, sore throat, vomiting, nausea, somnolence, esophageal and stomach trauma or perforation, bleeding, low blood pressure, aspiration, pneumonia, infection, trauma to the teeth and death.    After a procedural time-out, the oropharynx was anesthetized with 20% benzocaine spray. The patient was given 2 mg versed and 50 mcg fentanyl for moderate sedation, followed by a propofol infusion, managed by the anesthesiology service.   The transesophageal probe was inserted in the esophagus and stomach without difficulty and multiple views were obtained.  The patient was kept under observation until the patient left the procedure room.  The patient left the procedure room in stable condition.   Agitated microbubble saline contrast was not administered.  COMPLICATIONS:    There were no immediate complications.  FINDINGS:  No LA thrombus seen Spontaneous echo contrast, moderate Dilated LA. Dilated LV, hypertrophic, EF 40%, global hypokinesis  RECOMMENDATIONS:   Cardioversion  Time Spent Directly with the Patient:  30 minutes   Mihai Croitoru 07/28/2013, 9:47 AM

## 2013-07-28 NOTE — Progress Notes (Signed)
Echocardiogram Echocardiogram Transesophageal has been performed.  Genene ChurnJames M Reel 07/28/2013, 9:58 AM

## 2013-07-28 NOTE — Interval H&P Note (Signed)
History and Physical Interval Note:  07/28/2013 10:23 AM  Edward Johnson  has presented today for surgery, with the diagnosis of AFIB  The various methods of treatment have been discussed with the patient and family. After consideration of risks, benefits and other options for treatment, the patient has consented to  Procedure(s) with comments: TRANSESOPHAGEAL ECHOCARDIOGRAM (TEE) (N/A) - Supposed to be TEE/Cardioversion, Trish will call anesthesia in the morning.  CARDIOVERSION (N/A) as a surgical intervention .  The patient's history has been reviewed, patient examined, no change in status, stable for surgery.  I have reviewed the patient's chart and labs.  Questions were answered to the patient's satisfaction.     Mihai Croitoru

## 2013-07-28 NOTE — Transfer of Care (Signed)
Immediate Anesthesia Transfer of Care Note  Patient: Myrtie NeitherShawn Brossman  Procedure(s) Performed: Procedure(s) with comments: TRANSESOPHAGEAL ECHOCARDIOGRAM (TEE) (N/A) - Supposed to be TEE/Cardioversion, Trish will call anesthesia in the morning.  CARDIOVERSION (N/A)  Patient Location: PACU and Endoscopy Unit  Anesthesia Type:MAC  Level of Consciousness: awake and alert   Airway & Oxygen Therapy: Patient Spontanous Breathing and Patient connected to nasal cannula oxygen  Post-op Assessment: Report given to PACU RN and Post -op Vital signs reviewed and stable  Post vital signs: Reviewed and stable  Complications: No apparent anesthesia complications

## 2013-07-28 NOTE — Op Note (Signed)
Procedure: Electrical Cardioversion Indications:  Atrial Fibrillation  Procedure Details:  Consent: Risks of procedure as well as the alternatives and risks of each were explained to the (patient/caregiver).  Consent for procedure obtained.  Time Out: Verified patient identification, verified procedure, site/side was marked, verified correct patient position, special equipment/implants available, medications/allergies/relevent history reviewed, required imaging and test results available.  Performed  Patient placed on cardiac monitor, pulse oximetry, supplemental oxygen as necessary.  Sedation given: via Anesthesiology service Pacer pads placed anterior and posterior chest.  Cardioverted 1 time(s).  Cardioverted at 200J. Synchronized biphasic  Evaluation: Findings: Post procedure EKG shows: NSR Complications: None Patient did tolerate procedure well.  Time Spent Directly with the Patient:  45 minutes   Mihai Croitoru 07/28/2013, 9:53 AM

## 2013-07-28 NOTE — Anesthesia Preprocedure Evaluation (Addendum)
Anesthesia Evaluation  Patient identified by MRN, date of birth, ID band Patient awake    Reviewed: Allergy & Precautions, H&P , NPO status , Patient's Chart, lab work & pertinent test results, reviewed documented beta blocker date and time   Airway Mallampati: II TM Distance: >3 FB Neck ROM: full    Dental  (+) Dental Advisory Given   Pulmonary neg pulmonary ROS,  breath sounds clear to auscultation        Cardiovascular hypertension, On Medications + angina negative cardio ROS  Atrial Fibrillation Rhythm:regular     Neuro/Psych negative neurological ROS  negative psych ROS   GI/Hepatic negative GI ROS, Neg liver ROS,   Endo/Other  negative endocrine ROSMorbid obesity  Renal/GU negative Renal ROS  negative genitourinary   Musculoskeletal   Abdominal   Peds  Hematology negative hematology ROS (+)   Anesthesia Other Findings See surgeon's H&P   Reproductive/Obstetrics negative OB ROS                          Anesthesia Physical Anesthesia Plan  ASA: III  Anesthesia Plan: MAC and General   Post-op Pain Management:    Induction: Intravenous  Airway Management Planned: Mask and Nasal Cannula  Additional Equipment:   Intra-op Plan:   Post-operative Plan:   Informed Consent: I have reviewed the patients History and Physical, chart, labs and discussed the procedure including the risks, benefits and alternatives for the proposed anesthesia with the patient or authorized representative who has indicated his/her understanding and acceptance.   Dental Advisory Given and Dental advisory given  Plan Discussed with: CRNA and Surgeon  Anesthesia Plan Comments:        Anesthesia Quick Evaluation

## 2013-07-28 NOTE — Progress Notes (Signed)
Subjective: No CP  NO SOB Throat a little sore   Objective: Filed Vitals:   07/28/13 1020 07/28/13 1030 07/28/13 1100 07/28/13 1114  BP: 169/90 156/102 135/90 132/87  Pulse: 94 86 90 98  Temp:      TempSrc:      Resp: 16 19 20    Height:      Weight:      SpO2: 98% 97%     Weight change: -1 lb 12.8 oz (-0.816 kg)  Intake/Output Summary (Last 24 hours) at 07/28/13 1243 Last data filed at 07/28/13 0800  Gross per 24 hour  Intake    540 ml  Output    850 ml  Net   -310 ml    General: Alert, awake, oriented x3, in no acute distress Neck:  JVP is normal Heart: Regular rate and rhythm, without murmurs, rubs, gallops.  Lungs: Clear to auscultation.  No rales or wheezes. Exemities:  No edema.   Neuro: Grossly intact, nonfocal.  Tele:  SR 80s  Lab Results: Results for orders placed during the hospital encounter of 07/27/13 (from the past 24 hour(s))  CBC     Status: Abnormal   Collection Time    07/28/13  5:10 AM      Result Value Ref Range   WBC 9.0  4.0 - 10.5 K/uL   RBC 3.65 (*) 4.22 - 5.81 MIL/uL   Hemoglobin 11.6 (*) 13.0 - 17.0 g/dL   HCT 62.135.5 (*) 30.839.0 - 65.752.0 %   MCV 97.3  78.0 - 100.0 fL   MCH 31.8  26.0 - 34.0 pg   MCHC 32.7  30.0 - 36.0 g/dL   RDW 84.612.2  96.211.5 - 95.215.5 %   Platelets 262  150 - 400 K/uL  BASIC METABOLIC PANEL     Status: None   Collection Time    07/28/13  5:10 AM      Result Value Ref Range   Sodium 142  137 - 147 mEq/L   Potassium 3.9  3.7 - 5.3 mEq/L   Chloride 106  96 - 112 mEq/L   CO2 25  19 - 32 mEq/L   Glucose, Bld 84  70 - 99 mg/dL   BUN 12  6 - 23 mg/dL   Creatinine, Ser 8.410.97  0.50 - 1.35 mg/dL   Calcium 9.0  8.4 - 32.410.5 mg/dL   GFR calc non Af Amer >90  >90 mL/min   GFR calc Af Amer >90  >90 mL/min    Studies/Results: Dg Chest Port 1 View  07/27/2013   CLINICAL DATA:  Chest pain  EXAM: PORTABLE CHEST - 1 VIEW  COMPARISON:  01/28/2011  FINDINGS: Cardiac shadow is enlarged. The lungs are clear. No focal infiltrate is noted. The bony  structures are within normal limits.  IMPRESSION: Cardiomegaly without acute abnormality.   Electronically Signed   By: Alcide CleverMark  Lukens M.D.   On: 07/27/2013 02:23    Medications: Reviewed   @PROBHOSP @  1.  Atrial fib  Underwent DCCV  KEep on Xarelto   Keep on cardiazem for now.  Will need sleep study (snores)  F/U with T Brackbill   Will need repeat echo in a few months to see if LV dysfunction improves   I would place on Toprol XL 50 mg with BP and LV dysfunction if afib recurs.  2.  CHF  PRobably tachy induced  Will follow now that in SR    3.  HTN  BP lable but up  I  wold start low dose b blocker    Ambulate  If OK  D?C today.    LOS: 1 day   Pricilla Riffleaula V Ross 07/28/2013, 12:43 PM

## 2013-07-29 ENCOUNTER — Encounter (HOSPITAL_COMMUNITY): Payer: Self-pay | Admitting: Cardiovascular Disease

## 2013-08-02 ENCOUNTER — Encounter: Payer: Self-pay | Admitting: Internal Medicine

## 2013-08-02 ENCOUNTER — Ambulatory Visit: Payer: 59 | Attending: Internal Medicine | Admitting: Internal Medicine

## 2013-08-02 VITALS — BP 153/89 | HR 66 | Temp 99.1°F | Resp 16 | Ht 78.0 in | Wt 370.0 lb

## 2013-08-02 DIAGNOSIS — Z91199 Patient's noncompliance with other medical treatment and regimen due to unspecified reason: Secondary | ICD-10-CM | POA: Insufficient documentation

## 2013-08-02 DIAGNOSIS — I480 Paroxysmal atrial fibrillation: Secondary | ICD-10-CM | POA: Insufficient documentation

## 2013-08-02 DIAGNOSIS — Z9119 Patient's noncompliance with other medical treatment and regimen: Secondary | ICD-10-CM | POA: Insufficient documentation

## 2013-08-02 DIAGNOSIS — I10 Essential (primary) hypertension: Secondary | ICD-10-CM

## 2013-08-02 DIAGNOSIS — I4891 Unspecified atrial fibrillation: Secondary | ICD-10-CM | POA: Insufficient documentation

## 2013-08-02 NOTE — Progress Notes (Signed)
[facility] Pt is here following up after his cardioversion. Pt thinks that his new medication is giving him headaches.

## 2013-08-03 ENCOUNTER — Ambulatory Visit: Payer: 59 | Admitting: Cardiology

## 2013-08-03 ENCOUNTER — Telehealth: Payer: Self-pay | Admitting: Cardiology

## 2013-08-03 NOTE — Telephone Encounter (Signed)
New Message:  Pt is requesting an appt w/ Dr. Tenny Crawoss. Pt is established with Dr. Patty SermonsBrackbill from a consult back in 2012. Pt states he recently saw Dr. Tenny Crawoss at the hospital. He also saw Dr. Clifton JamesMcAlhany and Dr. Salena Saner with his recent hospital stay. However, pt is requesting to transfer his care to Dr. Tenny Crawoss. Is this ok with both Dr. Tenny Crawoss and Dr. Patty SermonsBrackbill?

## 2013-08-03 NOTE — Telephone Encounter (Signed)
Will be fine with dr Tenny Crawross

## 2013-08-03 NOTE — Telephone Encounter (Signed)
Follow Up:  Called pt and left VM to set up appt w/ Dr. Tenny Crawoss or PA.

## 2013-08-03 NOTE — Telephone Encounter (Signed)
Ok with  Dr. Patty SermonsBrackbill

## 2013-08-05 ENCOUNTER — Other Ambulatory Visit: Payer: Self-pay

## 2013-08-05 MED ORDER — RIVAROXABAN 20 MG PO TABS: 20.0000 mg | ORAL_TABLET | Freq: Every day | ORAL | Status: DC

## 2013-08-05 NOTE — Telephone Encounter (Signed)
Rx was sent to pharmacy electronically. 

## 2013-08-22 ENCOUNTER — Ambulatory Visit: Payer: 59 | Admitting: *Deleted

## 2013-08-22 ENCOUNTER — Ambulatory Visit (HOSPITAL_COMMUNITY)
Admission: RE | Admit: 2013-08-22 | Discharge: 2013-08-22 | Disposition: A | Discharge status: Home / Self Care | Payer: 59 | Source: Ambulatory Visit | Attending: Internal Medicine | Admitting: Internal Medicine

## 2013-08-22 VITALS — BP 162/89 | HR 66 | Temp 98.1°F | Resp 16 | Ht 78.0 in | Wt 362.4 lb

## 2013-08-22 DIAGNOSIS — M51379 Other intervertebral disc degeneration, lumbosacral region without mention of lumbar back pain or lower extremity pain: Secondary | ICD-10-CM | POA: Insufficient documentation

## 2013-08-22 DIAGNOSIS — M549 Dorsalgia, unspecified: Secondary | ICD-10-CM

## 2013-08-22 DIAGNOSIS — M5137 Other intervertebral disc degeneration, lumbosacral region: Secondary | ICD-10-CM | POA: Insufficient documentation

## 2013-08-22 MED ORDER — ACETAMINOPHEN-CODEINE #3 300-30 MG PO TABS: 1.0000 | ORAL_TABLET | ORAL | Status: DC | PRN

## 2013-08-22 NOTE — Patient Instructions (Signed)
Back Pain, Adult Low back pain is very common. About 1 in 5 people have back pain.The cause of low back pain is rarely dangerous. The pain often gets better over time.About half of people with a sudden onset of back pain feel better in just 2 weeks. About 8 in 10 people feel better by 6 weeks.  CAUSES Some common causes of back pain include:  Strain of the muscles or ligaments supporting the spine.  Wear and tear (degeneration) of the spinal discs.  Arthritis.  Direct injury to the back. DIAGNOSIS Most of the time, the direct cause of low back pain is not known.However, back pain can be treated effectively even when the exact cause of the pain is unknown.Answering your caregiver's questions about your overall health and symptoms is one of the most accurate ways to make sure the cause of your pain is not dangerous. If your caregiver needs more information, he or she may order lab work or imaging tests (X-rays or MRIs).However, even if imaging tests show changes in your back, this usually does not require surgery. HOME CARE INSTRUCTIONS For many people, back pain returns.Since low back pain is rarely dangerous, it is often a condition that people can learn to manageon their own.   Remain active. It is stressful on the back to sit or stand in one place. Do not sit, drive, or stand in one place for more than 30 minutes at a time. Take short walks on level surfaces as soon as pain allows.Try to increase the length of time you walk each day.  Do not stay in bed.Resting more than 1 or 2 days can delay your recovery.  Do not avoid exercise or work.Your body is made to move.It is not dangerous to be active, even though your back may hurt.Your back will likely heal faster if you return to being active before your pain is gone.  Pay attention to your body when you bend and lift. Many people have less discomfortwhen lifting if they bend their knees, keep the load close to their bodies,and  avoid twisting. Often, the most comfortable positions are those that put less stress on your recovering back.  Find a comfortable position to sleep. Use a firm mattress and lie on your side with your knees slightly bent. If you lie on your back, put a pillow under your knees.  Only take over-the-counter or prescription medicines as directed by your caregiver. Over-the-counter medicines to reduce pain and inflammation are often the most helpful.Your caregiver may prescribe muscle relaxant drugs.These medicines help dull your pain so you can more quickly return to your normal activities and healthy exercise.  Put ice on the injured area.  Put ice in a plastic bag.  Place a towel between your skin and the bag.  Leave the ice on for 15-20 minutes, 03-04 times a day for the first 2 to 3 days. After that, ice and heat may be alternated to reduce pain and spasms.  Ask your caregiver about trying back exercises and gentle massage. This may be of some benefit.  Avoid feeling anxious or stressed.Stress increases muscle tension and can worsen back pain.It is important to recognize when you are anxious or stressed and learn ways to manage it.Exercise is a great option. SEEK MEDICAL CARE IF:  You have pain that is not relieved with rest or medicine.  You have pain that does not improve in 1 week.  You have new symptoms.  You are generally not feeling well. SEEK   IMMEDIATE MEDICAL CARE IF:   You have pain that radiates from your back into your legs.  You develop new bowel or bladder control problems.  You have unusual weakness or numbness in your arms or legs.  You develop nausea or vomiting.  You develop abdominal pain.  You feel faint. Document Released: 03/03/2005 Document Revised: 09/02/2011 Document Reviewed: 07/22/2010 [employer] Patient Information 2014 [employer].  
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
## 2013-08-25 ENCOUNTER — Telehealth: Payer: Self-pay

## 2013-08-25 NOTE — Telephone Encounter (Signed)
Message copied by Lestine Mount on Thu Aug 25, 2013  2:15 PM ------      Message from: Jeanann Lewandowsky E      Created: Wed Aug 24, 2013 11:12 AM       Please inform patient that her lumbar spine x-ray shows mild osteoarthritis ------

## 2013-08-25 NOTE — Telephone Encounter (Signed)
Patient not available Left message on voice mail to return our call 

## 2013-09-13 ENCOUNTER — Ambulatory Visit: Payer: 59 | Admitting: Internal Medicine

## 2013-10-09 NOTE — Progress Notes (Signed)
Patient ID: Edward Johnson, male   DOB: 1971/02/25, 43 y.o.   MRN: [id number]   Edward Johnson, is a 43 y.o. male  [id number]  [id number]  DOB - [birth year]  CC:  Chief Complaint  Patient presents with  . Hospitalization Follow-up       HPI: Edward Johnson is a 43 y.o. male here today to establish medical care.  Patient has long history of paroxysmal atrial fibrillation with cardioversions 2005, 2007, 2012, HTN, medical non-compliance was recently seen in the ED with c/o palpitations and found to be in atrial fibrillation with RVR. He was appropriately treated and is rate controlled. He has a long history of PAF with multiple admission over the years for cardioversion but he has been non-compliant with medical therapy and has not followed as scheduled. Patient is now on Xarelto 20 mg tablet daily and metoprolol 25 mg tablet by mouth daily. He has no new complaints today. He still has her medications with refills. He is here for hospital followup. Patient has No headache, No chest pain, No abdominal pain - No Nausea, No new weakness tingling or numbness, No Cough - SOB.  No Known Allergies Past Medical History  Diagnosis Date  . Atrial fibrillation     history of paroxysmal afib, s/p TEE cardioversion (2005, 2007, 2012), and chemical cardioversion with flecainide (2005, 2011)  . History of noncompliance with medical treatment   . Borderline blood pressure     borderline BP's during acute hospitalizations, no data from outpatient setting  . Morbid obesity    Current Outpatient Prescriptions on File Prior to Visit  Medication Sig Dispense Refill  . metoprolol succinate (TOPROL-XL) 25 MG 24 hr tablet Take 3 tablets (75 mg total) by mouth daily.  100 tablet  11   No current facility-administered medications on file prior to visit.   Family History  Problem Relation Age of Onset  . Cancer - Other Mother     Uterine  . CAD Neg Hx    History   Social History  . Marital Status: Single      Spouse Name: N/A    Number of Children: N/A  . Years of Education: N/A   Occupational History  . Not on file.   Social History Main Topics  . Smoking status: Never Smoker   . Smokeless tobacco: Never Used  . Alcohol Use: No     Comment: OCCASIONAL  . Drug Use: No  . Sexual Activity: Not on file   Other Topics Concern  . Not on file   Social History Narrative  . No narrative on file    Review of Systems: Constitutional: Negative for fever, chills, diaphoresis, activity change, appetite change and fatigue. HENT: Negative for ear pain, nosebleeds, congestion, facial swelling, rhinorrhea, neck pain, neck stiffness and ear discharge.  Eyes: Negative for pain, discharge, redness, itching and visual disturbance. Respiratory: Negative for cough, choking, chest tightness, shortness of breath, wheezing and stridor.  Cardiovascular: Negative for chest pain, palpitations and leg swelling. Gastrointestinal: Negative for abdominal distention. Genitourinary: Negative for dysuria, urgency, frequency, hematuria, flank pain, decreased urine volume, difficulty urinating and dyspareunia.  Musculoskeletal: Negative for back pain, joint swelling, arthralgia and gait problem. Neurological: Negative for dizziness, tremors, seizures, syncope, facial asymmetry, speech difficulty, weakness, light-headedness, numbness and headaches.  Hematological: Negative for adenopathy. Does not bruise/bleed easily. Psychiatric/Behavioral: Negative for hallucinations, behavioral problems, confusion, dysphoric mood, decreased concentration and agitation.    Objective:   Filed Vitals:   08/02/13 1442  BP:  153/89  Pulse: 66  Temp: 99.1 F (37.3 C)  Resp: 16    Physical Exam: Constitutional: Patient appears well-developed and well-nourished. No distress. HENT: Normocephalic, atraumatic, External right and left ear normal. Oropharynx is clear and moist.  Eyes: Conjunctivae and EOM are normal. PERRLA, no  scleral icterus. Neck: Normal ROM. Neck supple. No JVD. No tracheal deviation. No thyromegaly. CVS: RRR, S1/S2 +, no murmurs, no gallops, no carotid bruit.  Pulmonary: Effort and breath sounds normal, no stridor, rhonchi, wheezes, rales.  Abdominal: Soft. BS +, no distension, tenderness, rebound or guarding.  Musculoskeletal: Normal range of motion. No edema and no tenderness.  Lymphadenopathy: No lymphadenopathy noted, cervical, inguinal or axillary Neuro: Alert. Normal reflexes, muscle tone coordination. No cranial nerve deficit. Skin: Skin is warm and dry. No rash noted. Not diaphoretic. No erythema. No pallor. Psychiatric: Normal mood and affect. Behavior, judgment, thought content normal.  Lab Results  Component Value Date   WBC 9.0 07/28/2013   HGB 11.6* 07/28/2013   HCT 35.5* 07/28/2013   MCV 97.3 07/28/2013   PLT 262 07/28/2013   Lab Results  Component Value Date   CREATININE 0.97 07/28/2013   BUN 12 07/28/2013   NA 142 07/28/2013   K 3.9 07/28/2013   CL 106 07/28/2013   CO2 25 07/28/2013    Lab Results  Component Value Date   HGBA1C 5.6 07/28/2013   Lipid Panel     Component Value Date/Time   CHOL 193 01/29/2011 0602   TRIG 103 01/29/2011 0602   HDL 42 01/29/2011 0602   CHOLHDL 4.6 01/29/2011 0602   VLDL 21 01/29/2011 0602   LDLCALC 130* 01/29/2011 0602       Assessment and plan:   1. Paroxysmal a-fib Patient was counseled extensively about medication compliance Patient was educated on his diagnosis and the importance of compliance with medications and followup. Consequences of not doing this were discussed  2. HTN (hypertension) DASH diet Patient encouraged to comply with medication and followup Patient was extensively counseled on nutrition and exercise  Return in about 3 months (around 11/02/2013), or if symptoms worsen or fail to improve, for Cardiologist - Dr. Daleen SquibbWall.  The patient was given clear instructions to go to ER or return to medical center if symptoms  don't improve, worsen or new problems develop. The patient verbalized understanding. The patient was told to call to get lab results if they haven't heard anything in the next week.     This note has been created with Education officer, environmentalDragon speech recognition software and smart phrase technology. Any transcriptional errors are unintentional.    Jeanann LewandowskyJEGEDE, OLUGBEMIGA, MD, MHA, FACP, FAAP [facility] [place], [place] [phone]

## 2014-08-16 ENCOUNTER — Other Ambulatory Visit: Payer: Self-pay | Admitting: Cardiology

## 2014-08-16 NOTE — Telephone Encounter (Signed)
Rx(s) sent to pharmacy electronically. Patient needs appointment for future refills.

## 2014-08-21 ENCOUNTER — Other Ambulatory Visit: Payer: Self-pay | Admitting: Cardiology

## 2014-08-21 NOTE — Telephone Encounter (Signed)
Rx has been sent to the pharmacy electronically. ° °

## 2014-11-19 ENCOUNTER — Encounter (HOSPITAL_COMMUNITY): Payer: Self-pay | Admitting: *Deleted

## 2014-11-19 ENCOUNTER — Emergency Department (HOSPITAL_COMMUNITY)
Admission: EM | Admit: 2014-11-19 | Discharge: 2014-11-19 | Disposition: A | Discharge status: Home / Self Care | Payer: 59 | Attending: Emergency Medicine | Admitting: Emergency Medicine

## 2014-11-19 DIAGNOSIS — Z79899 Other long term (current) drug therapy: Secondary | ICD-10-CM | POA: Diagnosis not present

## 2014-11-19 DIAGNOSIS — R42 Dizziness and giddiness: Secondary | ICD-10-CM | POA: Diagnosis present

## 2014-11-19 DIAGNOSIS — R531 Weakness: Secondary | ICD-10-CM | POA: Diagnosis not present

## 2014-11-19 DIAGNOSIS — R079 Chest pain, unspecified: Secondary | ICD-10-CM | POA: Insufficient documentation

## 2014-11-19 LAB — CBC
HCT: 38.5 % — ABNORMAL LOW (ref 39.0–52.0)
Hemoglobin: 12.7 g/dL — ABNORMAL LOW (ref 13.0–17.0)
MCH: 32.2 pg (ref 26.0–34.0)
MCHC: 33 g/dL (ref 30.0–36.0)
MCV: 97.7 fL (ref 78.0–100.0)
Platelets: 256 10*3/uL (ref 150–400)
RBC: 3.94 MIL/uL — ABNORMAL LOW (ref 4.22–5.81)
RDW: 11.7 % (ref 11.5–15.5)
WBC: 6.9 10*3/uL (ref 4.0–10.5)

## 2014-11-19 LAB — BASIC METABOLIC PANEL
Anion gap: 5 (ref 5–15)
BUN: 9 mg/dL (ref 6–20)
CO2: 29 mmol/L (ref 22–32)
Calcium: 9.2 mg/dL (ref 8.9–10.3)
Chloride: 104 mmol/L (ref 101–111)
Creatinine, Ser: 0.96 mg/dL (ref 0.61–1.24)
GFR calc Af Amer: >60 mL/min (ref >60)
GFR calc non Af Amer: >60 mL/min (ref >60)
Glucose, Bld: 126 mg/dL — ABNORMAL HIGH (ref 65–99)
Potassium: 3.6 mmol/L (ref 3.5–5.1)
Sodium: 138 mmol/L (ref 135–145)

## 2014-11-19 LAB — CBG MONITORING, ED: Glucose-Capillary: 139 mg/dL — ABNORMAL HIGH (ref 65–99)

## 2014-11-19 NOTE — ED Provider Notes (Addendum)
CSN: [id number]     Arrival date & time 11/19/14  1342 History   First MD Initiated Contact with Patient 11/19/14 1610     Chief Complaint  Patient presents with  . Dizziness  . Chest Pain     (Consider location/radiation/quality/duration/timing/severity/associated sxs/prior Treatment) HPI Comments: Patient presents to the ER for evaluation of generalized weakness. Patient reports that he had a nightmare 3 weeks ago at which point he woke up and was experiencing heart palpitations and chest pain. He reports that this slowly resolved and has not reoccurred. Since then he has just felt very sluggish and weak. He does report that he has been sleeping the last couple of nights. He has not had any illness, denies recurrent chest pain, no cough, shortness of breath, fever, nausea, vomiting or diarrhea. He feels dizzy at times. Patient has history of atrial fibrillation, on Toprol and Xarelto. Has been taking his medications as prescribed.  Patient is a 44 y.o. male presenting with dizziness and chest pain.  Dizziness Associated symptoms: chest pain   Chest Pain Associated symptoms: dizziness and fatigue     Past Medical History  Diagnosis Date  . Atrial fibrillation     history of paroxysmal afib, s/p TEE cardioversion (2005, 2007, 2012), and chemical cardioversion with flecainide (2005, 2011)  . History of noncompliance with medical treatment   . Borderline blood pressure     borderline BP's during acute hospitalizations, no data from outpatient setting  . Morbid obesity    Past Surgical History  Procedure Laterality Date  . Cardioversion  01/29/2011    Procedure: CARDIOVERSION;  Surgeon: Lewayne Bunting, MD;  Location: [facility] ENDOSCOPY;  Service: Cardiovascular;  Laterality: N/A;  . Tee without cardioversion  01/29/2011    Procedure: TRANSESOPHAGEAL ECHOCARDIOGRAM (TEE);  Surgeon: Lewayne Bunting, MD;  Location: [facility] ENDOSCOPY;  Service: Cardiovascular;  Laterality: N/A;  . Tee without  cardioversion N/A 07/28/2013    Procedure: TRANSESOPHAGEAL ECHOCARDIOGRAM (TEE);  Surgeon: Thurmon Fair, MD;  Location: [facility] ENDOSCOPY;  Service: Cardiovascular;  Laterality: N/A;  Supposed to be TEE/Cardioversion, Trish will call anesthesia in the morning.   . Cardioversion N/A 07/28/2013    Procedure: CARDIOVERSION;  Surgeon: Thurmon Fair, MD;  Location: [facility] ENDOSCOPY;  Service: Cardiovascular;  Laterality: N/A;   Family History  Problem Relation Age of Onset  . Cancer - Other Mother     Uterine  . CAD Neg Hx    Social History  Substance Use Topics  . Smoking status: Never Smoker   . Smokeless tobacco: Never Used  . Alcohol Use: No     Comment: OCCASIONAL    Review of Systems  Constitutional: Positive for fatigue.  Cardiovascular: Positive for chest pain.  Neurological: Positive for dizziness.  All other systems reviewed and are negative.     Allergies  Review of patient's allergies indicates no known allergies.  Home Medications   Prior to Admission medications   Medication Sig Start Date End Date Taking? Authorizing Provider  acetaminophen-codeine (TYLENOL #3) 300-30 MG per tablet Take 1 tablet by mouth every 4 (four) hours as needed for moderate pain. 08/22/13   Quentin Angst, MD  metoprolol succinate (TOPROL-XL) 25 MG 24 hr tablet Take 3 tablets (75 mg total) by mouth daily. NEEDS APPOINTMENT FOR FUTURE REFILLS 08/16/14   Abelino Derrick, PA-C  rivaroxaban (XARELTO) 20 MG TABS tablet Take 1 tablet (20 mg total) by mouth daily with supper. NEEDS APPOINTMENT FOR FUTURE REFILLS 08/16/14   Abelino Derrick,  PA-C  rivaroxaban (XARELTO) 20 MG TABS tablet Take 1 tablet (20 mg total) by mouth daily with supper. Need appointment before anymore refills 08/21/14   Eda Paschal Kilroy, PA-C   BP 136/78 mmHg  Pulse 57  Temp(Src) 98 F (36.7 C) (Oral)  Resp 18  Ht 6\' 5"  (1.956 m)  Wt 362 lb (164.202 kg)  BMI 42.92 kg/m2  SpO2 97% Physical Exam  Constitutional: He is oriented to person,  place, and time. He appears well-developed and well-nourished. No distress.  HENT:  Head: Normocephalic and atraumatic.  Right Ear: Hearing normal.  Left Ear: Hearing normal.  Nose: Nose normal.  Mouth/Throat: Oropharynx is clear and moist and mucous membranes are normal.  Eyes: Conjunctivae and EOM are normal. Pupils are equal, round, and reactive to light.  Neck: Normal range of motion. Neck supple.  Cardiovascular: Regular rhythm, S1 normal and S2 normal.  Exam reveals no gallop and no friction rub.   No murmur heard. Pulmonary/Chest: Effort normal and breath sounds normal. No respiratory distress. He exhibits no tenderness.  Abdominal: Soft. Normal appearance and bowel sounds are normal. There is no hepatosplenomegaly. There is no tenderness. There is no rebound, no guarding, no tenderness at McBurney's point and negative Murphy's sign. No hernia.  Musculoskeletal: Normal range of motion.  Neurological: He is alert and oriented to person, place, and time. He has normal strength. No cranial nerve deficit or sensory deficit. Coordination normal. GCS eye subscore is 4. GCS verbal subscore is 5. GCS motor subscore is 6.  Extraocular muscle movement: normal No visual field cut Pupils: equal and reactive both direct and consensual response is normal No nystagmus present    Sensory function is intact to light touch, pinprick Proprioception intact  Grip strength 5/5 symmetric in upper extremities No pronator drift Normal finger to nose bilaterally  Lower extremity strength 5/5 against gravity Normal heel to shin bilaterally  Gait: normal   Skin: Skin is warm, dry and intact. No rash noted. No cyanosis.  Psychiatric: He has a normal mood and affect. His speech is normal and behavior is normal. Thought content normal.  Nursing note and vitals reviewed.   ED Course  Procedures (including critical care time) Labs Review Labs Reviewed  BASIC METABOLIC PANEL - Abnormal; Notable for the  following:    Glucose, Bld 126 (*)    All other components within normal limits  CBC - Abnormal; Notable for the following:    RBC 3.94 (*)    Hemoglobin 12.7 (*)    HCT 38.5 (*)    All other components within normal limits  CBG MONITORING, ED - Abnormal; Notable for the following:    Glucose-Capillary 139 (*)    All other components within normal limits  URINALYSIS, ROUTINE W REFLEX MICROSCOPIC (NOT AT [facility])    Imaging Review No results found. I have personally reviewed and evaluated these images and lab results as part of my medical decision-making.   EKG Interpretation   Date/Time:  Sunday November 19 2014 13:48:40 EDT Ventricular Rate:  66 PR Interval:  162 QRS Duration: 86 QT Interval:  386 QTC Calculation: 404 R Axis:   55 Text Interpretation:  Normal sinus rhythm Nonspecific T wave abnormality  Abnormal ECG Confirmed by POLLINA  MD, CHRISTOPHER [phone]) on 11/19/2014  4:13:13 PM      MDM   Final diagnoses:  None   generalized weakness  Patient presents to the ER primarily for generalized weakness. He did have an episode 3 nights ago  where he had a nightmare and then had chest pain and palpitations. Symptoms resolved at night and has not reoccurred. He does have a history of atrial fibrillation, but is in sinus rhythm today. Additionally, he is on Xarelto and has not missed any meds. He has not had any bleeding, hemoglobin is above baseline. Cannot rule out paroxysmal atrial fibrillation 3 nights ago, but would not require any further treatment at this time as he is anticoagulated and in sinus rhythm today. EKG does not show any obvious ischemia or infarct. He has not had any chest pain since the nightmare. Neurologic examination is entirely normal. No focal neurologic deficits that would warrant imaging. Patient reassured, no further workup is necessary at this time.    Gilda Crease, MD 11/19/14 1627  Gilda Crease, MD 11/19/14 [phone]

## 2014-11-19 NOTE — Discharge Instructions (Signed)

## 2014-11-19 NOTE — ED Notes (Addendum)
Pt states 4 days ago he had a nightmare, woke up and began having palpitations, feeling dizzy and having chest pain.  Pt denies chest pain now, but c/o dizziness and having difficulty ambulating/weak.  Pt normal sinus on monitor in triage.

## 2015-02-23 ENCOUNTER — Other Ambulatory Visit: Payer: Self-pay | Admitting: Sports Medicine

## 2015-02-23 DIAGNOSIS — M25561 Pain in right knee: Secondary | ICD-10-CM

## 2015-02-25 ENCOUNTER — Ambulatory Visit
Admission: RE | Admit: 2015-02-25 | Discharge: 2015-02-25 | Disposition: A | Discharge status: Home / Self Care | Payer: 59 | Source: Ambulatory Visit | Attending: Sports Medicine | Admitting: Sports Medicine

## 2015-02-25 DIAGNOSIS — M25561 Pain in right knee: Secondary | ICD-10-CM

## 2018-09-27 ENCOUNTER — Other Ambulatory Visit: Payer: Self-pay | Admitting: Family Medicine

## 2018-09-27 DIAGNOSIS — N644 Mastodynia: Secondary | ICD-10-CM

## 2018-10-08 ENCOUNTER — Ambulatory Visit: Payer: 59

## 2018-10-08 ENCOUNTER — Ambulatory Visit
Admission: RE | Admit: 2018-10-08 | Discharge: 2018-10-08 | Disposition: A | Discharge status: Home / Self Care | Payer: 59 | Source: Ambulatory Visit | Attending: Family Medicine | Admitting: Family Medicine

## 2018-10-08 ENCOUNTER — Other Ambulatory Visit: Payer: Self-pay

## 2018-10-08 DIAGNOSIS — N644 Mastodynia: Secondary | ICD-10-CM

## 2018-10-12 ENCOUNTER — Other Ambulatory Visit: Payer: Self-pay | Admitting: Family Medicine

## 2018-11-29 ENCOUNTER — Other Ambulatory Visit: Payer: Self-pay

## 2018-11-29 ENCOUNTER — Emergency Department (HOSPITAL_COMMUNITY): Payer: 59

## 2018-11-29 ENCOUNTER — Emergency Department (HOSPITAL_COMMUNITY)
Admission: EM | Admit: 2018-11-29 | Discharge: 2018-11-29 | Disposition: A | Discharge status: Home / Self Care | Payer: 59 | Attending: Emergency Medicine | Admitting: Emergency Medicine

## 2018-11-29 DIAGNOSIS — Z79899 Other long term (current) drug therapy: Secondary | ICD-10-CM | POA: Insufficient documentation

## 2018-11-29 DIAGNOSIS — R10817 Generalized abdominal tenderness: Secondary | ICD-10-CM | POA: Diagnosis not present

## 2018-11-29 DIAGNOSIS — I4891 Unspecified atrial fibrillation: Secondary | ICD-10-CM | POA: Insufficient documentation

## 2018-11-29 DIAGNOSIS — Z7901 Long term (current) use of anticoagulants: Secondary | ICD-10-CM | POA: Diagnosis not present

## 2018-11-29 DIAGNOSIS — I10 Essential (primary) hypertension: Secondary | ICD-10-CM | POA: Diagnosis not present

## 2018-11-29 DIAGNOSIS — R0789 Other chest pain: Secondary | ICD-10-CM | POA: Diagnosis not present

## 2018-11-29 DIAGNOSIS — R079 Chest pain, unspecified: Secondary | ICD-10-CM

## 2018-11-29 LAB — CBG MONITORING, ED: Glucose-Capillary: 96 mg/dL (ref 70–99)

## 2018-11-29 LAB — COMPREHENSIVE METABOLIC PANEL
ALT: 61 U/L — ABNORMAL HIGH (ref 0–44)
AST: 36 U/L (ref 15–41)
Albumin: 3.6 g/dL (ref 3.5–5.0)
Alkaline Phosphatase: 43 U/L (ref 38–126)
Anion gap: 7 (ref 5–15)
BUN: 10 mg/dL (ref 6–20)
CO2: 26 mmol/L (ref 22–32)
Calcium: 8.8 mg/dL — ABNORMAL LOW (ref 8.9–10.3)
Chloride: 105 mmol/L (ref 98–111)
Creatinine, Ser: 1.01 mg/dL (ref 0.61–1.24)
GFR calc Af Amer: >60 mL/min (ref >60)
GFR calc non Af Amer: >60 mL/min (ref >60)
Glucose, Bld: 98 mg/dL (ref 70–99)
Potassium: 3.9 mmol/L (ref 3.5–5.1)
Sodium: 138 mmol/L (ref 135–145)
Total Bilirubin: 0.9 mg/dL (ref 0.3–1.2)
Total Protein: 7.1 g/dL (ref 6.5–8.1)

## 2018-11-29 LAB — CBC
HCT: 37.8 % — ABNORMAL LOW (ref 39.0–52.0)
Hemoglobin: 12 g/dL — ABNORMAL LOW (ref 13.0–17.0)
MCH: 32.4 pg (ref 26.0–34.0)
MCHC: 31.7 g/dL (ref 30.0–36.0)
MCV: 102.2 fL — ABNORMAL HIGH (ref 80.0–100.0)
Platelets: 266 10*3/uL (ref 150–400)
RBC: 3.7 MIL/uL — ABNORMAL LOW (ref 4.22–5.81)
RDW: 11.4 % — ABNORMAL LOW (ref 11.5–15.5)
WBC: 7.4 10*3/uL (ref 4.0–10.5)
nRBC: 0 % (ref 0.0–0.2)

## 2018-11-29 LAB — TROPONIN I (HIGH SENSITIVITY)
Troponin I (High Sensitivity): 7 ng/L (ref <18)
Troponin I (High Sensitivity): 7 ng/L (ref <18)

## 2018-11-29 LAB — D-DIMER, QUANTITATIVE: D-Dimer, Quant: <0.27 ug/mL-FEU (ref 0.00–0.50)

## 2018-11-29 LAB — LIPASE, BLOOD: Lipase: 32 U/L (ref 11–51)

## 2018-11-29 MED ORDER — ASPIRIN 81 MG PO CHEW
324.0000 mg | CHEWABLE_TABLET | Freq: Once | ORAL | Status: AC
  Administered 2018-11-29: 324 mg via ORAL
  Filled 2018-11-29: qty 4

## 2018-11-29 MED ORDER — NITROGLYCERIN 0.4 MG SL SUBL
0.4000 mg | SUBLINGUAL_TABLET | SUBLINGUAL | Status: DC | PRN
  Administered 2018-11-29 (×2): 0.4 mg via SUBLINGUAL
  Filled 2018-11-29: qty 1

## 2018-11-29 NOTE — ED Notes (Signed)
Pt given dc instructions pt verbalizes understanding.  

## 2018-11-29 NOTE — Discharge Instructions (Addendum)
You were seen in the ED today for chest pain Your labwork, EKG, and chest x ray were all reassuring today Please call your PCP's office today to schedule an appointment as soon as possible for further evaluation  Return to the ED IMMEDIATELY for any worsening symptoms including worsening chest pain, severe back pain, shortness of breath, nausea/vomiting, feelings of lightheadedness or if you pass out.

## 2018-11-29 NOTE — ED Notes (Signed)
Radiology at bedside

## 2018-11-29 NOTE — ED Triage Notes (Signed)
Pt co left sided chest pain that radiates to his back that started abt 0200. Chest pain is sharp.

## 2018-11-29 NOTE — ED Provider Notes (Signed)
[facility] EMERGENCY DEPARTMENT Provider Note   CSN: [id number] Arrival date & time: 11/29/18  8341     History   Chief Complaint No chief complaint on file.   HPI Edward Johnson is a 48 y.o. male with PMHx HTN, A fib on Xarelto, obesity who presents to the ED planing of sudden onset, constant, sharp, left-sided chest pain that radiates into his back that started around 2 AM.  And also complains of diaphoresis.  He has not taken anything for his pain.  States that he has had chest pain in the past but never this severe.  Currently having chest pain in the ED.  Denies family history of CAD.  States that he sometimes misses a dose or 2 of his Xarelto but always catches up.  No history of DVT/PE.  No recent prolonged travel or immobilization.  Patient has never smoker.  Denies fever, chills, cough, shortness of breath, leg swelling, nausea, vomiting, syncope, or any other associated symptoms.   Past Medical History:  Diagnosis Date  . Atrial fibrillation    history of paroxysmal afib, s/p TEE cardioversion (2005, 2007, 2012), and chemical cardioversion with flecainide (2005, 2011)  . Borderline blood pressure    borderline BP's during acute hospitalizations, no data from outpatient setting  . History of noncompliance with medical treatment   . Morbid obesity     Patient Active Problem List   Diagnosis Date Noted  . Paroxysmal A-fib (HCC) 08/02/2013  . HTN (hypertension) 08/02/2013  . Morbid obesity (HCC) 07/28/2013  . Atrial fibrillation with rapid ventricular response- TEE CV 07/28/13 01/28/2011  . History of noncompliance with medical treatment 01/28/2011  . Borderline blood pressure 01/28/2011    Past Surgical History:  Procedure Laterality Date  . CARDIOVERSION  01/29/2011   Procedure: CARDIOVERSION;  Surgeon: Lewayne Bunting, MD;  Location: [facility] ENDOSCOPY;  Service: Cardiovascular;  Laterality: N/A;  . CARDIOVERSION N/A 07/28/2013   Procedure: CARDIOVERSION;   Surgeon: Thurmon Fair, MD;  Location: [facility] ENDOSCOPY;  Service: Cardiovascular;  Laterality: N/A;  . TEE WITHOUT CARDIOVERSION  01/29/2011   Procedure: TRANSESOPHAGEAL ECHOCARDIOGRAM (TEE);  Surgeon: Lewayne Bunting, MD;  Location: [facility] ENDOSCOPY;  Service: Cardiovascular;  Laterality: N/A;  . TEE WITHOUT CARDIOVERSION N/A 07/28/2013   Procedure: TRANSESOPHAGEAL ECHOCARDIOGRAM (TEE);  Surgeon: Thurmon Fair, MD;  Location: [facility] ENDOSCOPY;  Service: Cardiovascular;  Laterality: N/A;  Supposed to be TEE/Cardioversion, Trish will call anesthesia in the morning.         Home Medications    Prior to Admission medications   Medication Sig Start Date End Date Taking? Authorizing Provider  acetaminophen-codeine (TYLENOL #3) 300-30 MG per tablet Take 1 tablet by mouth every 4 (four) hours as needed for moderate pain. 08/22/13   Quentin Angst, MD  metoprolol succinate (TOPROL-XL) 25 MG 24 hr tablet Take 3 tablets (75 mg total) by mouth daily. NEEDS APPOINTMENT FOR FUTURE REFILLS 08/16/14   Abelino Derrick, PA-C  rivaroxaban (XARELTO) 20 MG TABS tablet Take 1 tablet (20 mg total) by mouth daily with supper. NEEDS APPOINTMENT FOR FUTURE REFILLS 08/16/14   Abelino Derrick, PA-C  rivaroxaban (XARELTO) 20 MG TABS tablet Take 1 tablet (20 mg total) by mouth daily with supper. Need appointment before anymore refills 08/21/14   Abelino Derrick, PA-C    Family History Family History  Problem Relation Age of Onset  . Cancer - Other Mother        Uterine  . CAD Neg Hx  Social History Social History   Tobacco Use  . Smoking status: Never Smoker  . Smokeless tobacco: Never Used  Substance Use Topics  . Alcohol use: No    Comment: OCCASIONAL  . Drug use: No     Allergies   Patient has no known allergies.   Review of Systems Review of Systems  Constitutional: Positive for diaphoresis. Negative for chills and fatigue.  HENT: Negative for congestion.   Eyes: Negative for visual disturbance.   Respiratory: Negative for cough and shortness of breath.   Cardiovascular: Positive for chest pain. Negative for palpitations and leg swelling.  Gastrointestinal: Negative for constipation, diarrhea, nausea and vomiting.  Genitourinary: Negative for difficulty urinating.  Musculoskeletal: Positive for back pain. Negative for myalgias.  Skin: Negative for rash.  Neurological: Negative for syncope and headaches.     Physical Exam Updated Vital Signs BP (!) 174/89 (BP Location: Right Arm)   Pulse 66   Temp 98.5 F (36.9 C) (Oral)   Resp 17   Ht 6\' 6"  (1.981 m)   Wt (!) 163.3 kg   SpO2 100%   BMI 41.60 kg/m   Physical Exam Vitals signs and nursing note reviewed.  Constitutional:      Appearance: He is not ill-appearing.  HENT:     Head: Normocephalic and atraumatic.  Eyes:     Conjunctiva/sclera: Conjunctivae normal.  Neck:     Musculoskeletal: Neck supple.  Cardiovascular:     Rate and Rhythm: Normal rate and regular rhythm.     Pulses: Normal pulses.  Pulmonary:     Effort: Pulmonary effort is normal.     Breath sounds: Normal breath sounds. No wheezing, rhonchi or rales.  Chest:     Chest wall: Tenderness present.  Abdominal:     Palpations: Abdomen is soft.     Tenderness: There is abdominal tenderness. There is no guarding or rebound.     Comments: Soft, mild diffuse tenderness, +BS throughout, no r/g/r, neg murphy's, neg mcburney's, no CVA TTP   Musculoskeletal:     Right lower leg: No edema.     Left lower leg: No edema.  Skin:    General: Skin is warm and dry.  Neurological:     Mental Status: He is alert.      ED Treatments / Results  Labs (all labs ordered are listed, but only abnormal results are displayed) Labs Reviewed  CBC - Abnormal; Notable for the following components:      Result Value   RBC 3.70 (*)    Hemoglobin 12.0 (*)    HCT 37.8 (*)    MCV 102.2 (*)    RDW 11.4 (*)    All other components within normal limits  COMPREHENSIVE  METABOLIC PANEL - Abnormal; Notable for the following components:   Calcium 8.8 (*)    ALT 61 (*)    All other components within normal limits  D-DIMER, QUANTITATIVE (NOT AT [facility])  LIPASE, BLOOD  CBG MONITORING, ED  TROPONIN I (HIGH SENSITIVITY)  TROPONIN I (HIGH SENSITIVITY)    EKG EKG Interpretation  Date/Time:  Monday November 29 2018 09:34:43 EDT Ventricular Rate:  64 PR Interval:    QRS Duration: 91 QT Interval:  401 QTC Calculation: 414 R Axis:   50 Text Interpretation:  Sinus rhythm Baseline wander No significant change since last tracing Confirmed by Cathren LaineSteinl, Kevin ([id number]) on 11/29/2018 9:52:20 AM   Radiology Dg Chest Portable 1 View  Result Date: 11/29/2018 CLINICAL DATA:  Chest pain EXAM:  PORTABLE CHEST 1 VIEW COMPARISON:  Jul 27, 2013 FINDINGS: Heart size is enlarged. There is no pneumothorax. No significant pleural effusion. No focal infiltrate. There is no acute osseous abnormality detected. IMPRESSION: No active disease. Electronically Signed   By: Constance Holster M.D.   On: 11/29/2018 10:03    Procedures Procedures (including critical care time)  Medications Ordered in ED Medications  nitroGLYCERIN (NITROSTAT) SL tablet 0.4 mg (0.4 mg Sublingual Given 11/29/18 1017)  aspirin chewable tablet 324 mg (324 mg Oral Given 11/29/18 1004)     Initial Impression / Assessment and Plan / ED Course  I have reviewed the triage vital signs and the nursing notes.  Pertinent labs & imaging results that were available during my care of the patient were reviewed by me and considered in my medical decision making (see chart for details).  Clinical Course as of Nov 28 1609  Mon Nov 29, 2018  1049 Baseline for patient  Hemoglobin(!): 12.0 [MV]    Clinical Course User Index [MV] Eustaquio Maize, [place]   48 year old male who presents to the ED today complaining of chest pain radiating into back x 2 AM this morning.  He is actively having chest pain in the ED. Also  complaining of sweating and radiation into back although pt always has chronic back pain. Vital signs stable without hypotension. Equal pulses bilaterally. Doubt dissection. Pt has TTP to chest as well as diffusely to abdomen on exam; he was not aware he was having abd pain until my exam. No peritoneal signs. EKG without ischemic changes today but will workup for ACS today. Pt states he has missed a couple of doses of his xarelto recently; will obtain d dimer. Pt given aspirin and NTG prn for pain. Will reevaluate once labs and imaging return.   EKG unchanged from previous. CXR negative. NO leukocytosis today. Hgb stable compared to previous. No electrolyte abnormalities. LFTs and Lipase within normal limits. D-dimer negative today. Initial trop of 7. Will repeat prior to disposition. Upon reevaluation pt has not had any chest pain after receiving 2 doses of NTG.   Repeat troponin of 7. Heart score of 5. Have discussed with patient admission vs close outpatient follow up. He would like to call his PCP today for further evaluation. I have discussed risks and benefits with him. Strict return precautions have been discussed as well. Pt to return if he is having anymore chest pain, shortness of breath, emesis, syncope. He is in agreement with plan at this time. All questions have been answered; stable for discharge home.   This note was prepared using Dragon voice recognition software and may include unintentional dictation errors due to the inherent limitations of voice recognition software.    HEAR Score: 5    Final Clinical Impressions(s) / ED Diagnoses   Final diagnoses:  Chest pain, unspecified type    ED Discharge Orders    None       Eustaquio Maize, PA-C 11/29/18 1611    Lajean Saver, MD 11/30/18 [phone]

## 2018-12-27 ENCOUNTER — Emergency Department (HOSPITAL_COMMUNITY)
Admission: EM | Admit: 2018-12-27 | Discharge: 2018-12-27 | Disposition: A | Discharge status: Home / Self Care | Payer: 59 | Attending: Emergency Medicine | Admitting: Emergency Medicine

## 2018-12-27 ENCOUNTER — Encounter (HOSPITAL_COMMUNITY): Payer: Self-pay | Admitting: Emergency Medicine

## 2018-12-27 ENCOUNTER — Emergency Department (HOSPITAL_COMMUNITY): Payer: 59

## 2018-12-27 DIAGNOSIS — W11XXXA Fall on and from ladder, initial encounter: Secondary | ICD-10-CM | POA: Diagnosis not present

## 2018-12-27 DIAGNOSIS — I10 Essential (primary) hypertension: Secondary | ICD-10-CM | POA: Insufficient documentation

## 2018-12-27 DIAGNOSIS — Z7901 Long term (current) use of anticoagulants: Secondary | ICD-10-CM | POA: Diagnosis not present

## 2018-12-27 DIAGNOSIS — M545 Low back pain, unspecified: Secondary | ICD-10-CM

## 2018-12-27 DIAGNOSIS — Z79899 Other long term (current) drug therapy: Secondary | ICD-10-CM | POA: Insufficient documentation

## 2018-12-27 LAB — BASIC METABOLIC PANEL
Anion gap: 9 (ref 5–15)
BUN: 10 mg/dL (ref 6–20)
CO2: 27 mmol/L (ref 22–32)
Calcium: 9.3 mg/dL (ref 8.9–10.3)
Chloride: 103 mmol/L (ref 98–111)
Creatinine, Ser: 1 mg/dL (ref 0.61–1.24)
GFR calc Af Amer: >60 mL/min (ref >60)
GFR calc non Af Amer: >60 mL/min (ref >60)
Glucose, Bld: 109 mg/dL — ABNORMAL HIGH (ref 70–99)
Potassium: 3.7 mmol/L (ref 3.5–5.1)
Sodium: 139 mmol/L (ref 135–145)

## 2018-12-27 LAB — CBC
HCT: 39.9 % (ref 39.0–52.0)
Hemoglobin: 13.2 g/dL (ref 13.0–17.0)
MCH: 33 pg (ref 26.0–34.0)
MCHC: 33.1 g/dL (ref 30.0–36.0)
MCV: 99.8 fL (ref 80.0–100.0)
Platelets: 256 10*3/uL (ref 150–400)
RBC: 4 MIL/uL — ABNORMAL LOW (ref 4.22–5.81)
RDW: 11.6 % (ref 11.5–15.5)
WBC: 8 10*3/uL (ref 4.0–10.5)
nRBC: 0 % (ref 0.0–0.2)

## 2018-12-27 MED ORDER — OXYCODONE-ACETAMINOPHEN 5-325 MG PO TABS: 1.0000 | ORAL_TABLET | Freq: Once | ORAL | Status: DC

## 2018-12-27 MED ORDER — HYDROCODONE-ACETAMINOPHEN 5-325 MG PO TABS: 1.0000 | ORAL_TABLET | Freq: Four times a day (QID) | ORAL | 0 refills | Status: DC | PRN

## 2018-12-27 MED ORDER — OXYCODONE-ACETAMINOPHEN 5-325 MG PO TABS
1.0000 | ORAL_TABLET | Freq: Once | ORAL | Status: AC
  Administered 2018-12-27: 1 via ORAL
  Filled 2018-12-27: qty 1

## 2018-12-27 MED ORDER — SODIUM CHLORIDE 0.9% FLUSH
3.0000 mL | Freq: Once | INTRAVENOUS | Status: AC
  Administered 2018-12-27: 3 mL via INTRAVENOUS

## 2018-12-27 MED ORDER — MORPHINE SULFATE (PF) 4 MG/ML IV SOLN
6.0000 mg | Freq: Once | INTRAVENOUS | Status: AC
  Administered 2018-12-27: 6 mg via INTRAVENOUS
  Filled 2018-12-27: qty 2

## 2018-12-27 NOTE — ED Notes (Signed)
Patient transported to CT 

## 2018-12-27 NOTE — ED Provider Notes (Signed)
[facility]GENC[facility] Provider Note   CSN: [id number] Arrival date & time: 12/27/18  1205     History   Chief Complaint Chief Complaint  Patient presents with  . Fall  . Back Pain  . Atrial Fibrillation    HPI Myrtie NeitherShawn Stapleton is a 48 y.o. male.     HPI   48 year old male with lower back pain.  He was cleaning his gutters yesterday when he fell from a ladder.  He states that he is only approximately 3 feet off the ground when he fell.  He landed straight onto his back onto a wooden deck.  He is on Xarelto for history of atrial fibrillation.  He states that he did not hit his head.  He denies any headache or neck pain.  His back pain was worse this morning and felt very stiff which is why presents the emergency room.  Occasional burning sensation into L thigh. No weakness. Has been ambulatory.   Past Medical History:  Diagnosis Date  . Atrial fibrillation (HCC)    history of paroxysmal afib, s/p TEE cardioversion (2005, 2007, 2012), and chemical cardioversion with flecainide (2005, 2011)  . Borderline blood pressure    borderline BP's during acute hospitalizations, no data from outpatient setting  . History of noncompliance with medical treatment   . Morbid obesity [facility](HCC)     Patient Active Problem List   Diagnosis Date Noted  . Paroxysmal A-fib (HCC) 08/02/2013  . HTN (hypertension) 08/02/2013  . Morbid obesity (HCC) 07/28/2013  . Atrial fibrillation with rapid ventricular response- TEE CV 07/28/13 01/28/2011  . History of noncompliance with medical treatment 01/28/2011  . Borderline blood pressure 01/28/2011    Past Surgical History:  Procedure Laterality Date  . CARDIOVERSION  01/29/2011   Procedure: CARDIOVERSION;  Surgeon: Lewayne BuntingBrian S Crenshaw, MD;  [facility];  Service: Cardiovascular;  Laterality: N/A;  . CARDIOVERSION N/A 07/28/2013   Procedure: CARDIOVERSION;  Surgeon: Thurmon FairMihai Croitoru, MD;  Location: [facility] ENDOSCOPY;  Service:  Cardiovascular;  Laterality: N/A;  . TEE WITHOUT CARDIOVERSION  01/29/2011   Procedure: TRANSESOPHAGEAL ECHOCARDIOGRAM (TEE);  Surgeon: Lewayne BuntingBrian S Crenshaw, MD;  [facility];  Service: Cardiovascular;  Laterality: N/A;  . TEE WITHOUT CARDIOVERSION N/A 07/28/2013   Procedure: TRANSESOPHAGEAL ECHOCARDIOGRAM (TEE);  Surgeon: Thurmon FairMihai Croitoru, MD;  [facility];  Service: Cardiovascular;  Laterality: N/A;  Supposed to be TEE/Cardioversion, Trish will call anesthesia in the morning.         Home Medications    Prior to Admission medications   Medication Sig Start Date End Date Taking? Authorizing Provider  acetaminophen-codeine (TYLENOL #3) 300-30 MG per tablet Take 1 tablet by mouth every 4 (four) hours as needed for moderate pain. 08/22/13   Quentin AngstJegede, Olugbemiga E, MD  metoprolol succinate (TOPROL-XL) 25 MG 24 hr tablet Take 3 tablets (75 mg total) by mouth daily. NEEDS APPOINTMENT FOR FUTURE REFILLS 08/16/14   Abelino DerrickKilroy, Luke K, PA-C  rivaroxaban (XARELTO) 20 MG TABS tablet Take 1 tablet (20 mg total) by mouth daily with supper. NEEDS APPOINTMENT FOR FUTURE REFILLS 08/16/14   Abelino DerrickKilroy, Luke K, PA-C  rivaroxaban (XARELTO) 20 MG TABS tablet Take 1 tablet (20 mg total) by mouth daily with supper. Need appointment before anymore refills 08/21/14   Abelino DerrickKilroy, Luke K, PA-C    Family History Family History  Problem Relation Age of Onset  . Cancer - Other Mother        Uterine  . CAD Neg Hx  Social History Social History   Tobacco Use  . Smoking status: Never Smoker  . Smokeless tobacco: Never Used  Substance Use Topics  . Alcohol use: No    Comment: OCCASIONAL  . Drug use: No     Allergies   Patient has no known allergies.   Review of Systems Review of Systems   All systems reviewed and negative, other than as noted in HPI.  Physical Exam Updated Vital Signs BP (!) 167/85 (BP Location: Right Arm)   Pulse 61   Temp 98.1 F (36.7 C) (Oral)   Resp 18   Ht 6\' 6"  (1.981 m)    Wt (!) 158.8 kg   SpO2 98%   BMI 40.45 kg/m   Physical Exam Vitals signs and nursing note reviewed.  Constitutional:      General: He is not in acute distress.    Appearance: He is well-developed.  HENT:     Head: Normocephalic and atraumatic.  Eyes:     General:        Right eye: No discharge.        Left eye: No discharge.     Conjunctiva/sclera: Conjunctivae normal.  Neck:     Musculoskeletal: Neck supple.  Cardiovascular:     Rate and Rhythm: Normal rate and regular rhythm.     Heart sounds: Normal heart sounds. No murmur. No friction rub. No gallop.   Pulmonary:     Effort: Pulmonary effort is normal. No respiratory distress.     Breath sounds: Normal breath sounds.  Abdominal:     General: There is no distension.     Palpations: Abdomen is soft.     Tenderness: There is no abdominal tenderness.  Musculoskeletal:        General: No tenderness.     Comments: Tenderness palpation in the upper to mid lumbar region both paraspinally and in the midline.  Does not seem significantly worse in the midline.  Strength is normal in bilateral lower extremities.  Sensation intact light touch.  Skin:    General: Skin is warm and dry.  Neurological:     Mental Status: He is alert.  Psychiatric:        Behavior: Behavior normal.        Thought Content: Thought content normal.      ED Treatments / Results  Labs (all labs ordered are listed, but only abnormal results are displayed) Labs Reviewed  BASIC METABOLIC PANEL - Abnormal; Notable for the following components:      Result Value   Glucose, Bld 109 (*)    All other components within normal limits  CBC - Abnormal; Notable for the following components:   RBC 4.00 (*)    All other components within normal limits    EKG EKG Interpretation  Date/Time:  Monday December 27 2018 12:51:50 EDT Ventricular Rate:  87 PR Interval:  148 QRS Duration: 82 QT Interval:  366 QTC Calculation: 440 R Axis:   46 Text  Interpretation:  Normal sinus rhythm Nonspecific T wave abnormality Abnormal ECG Confirmed by 11-[birth year]-1976 [phone]) on 12/27/2018 4:12:21 PM   Radiology Dg Chest 2 View  Result Date: 12/27/2018 CLINICAL DATA:  Back pain secondary to a fall yesterday. Atrial fibrillation. EXAM: CHEST - 2 VIEW COMPARISON:  Radiographs dated 11/29/2018 and 10/01/2009 FINDINGS: The heart size and mediastinal contours are within normal limits. Both lungs are clear. The visualized skeletal structures are unremarkable. IMPRESSION: Normal exam. Electronically Signed   By: 10/03/2009  M.D.   On: 12/27/2018 13:15   Dg Lumbar Spine Complete  Result Date: 12/27/2018 CLINICAL DATA:  Fall off ladder yesterday, pain in low back. EXAM: LUMBAR SPINE - COMPLETE 4+ VIEW COMPARISON:  08/22/2013. FINDINGS: On the AP view there is lucency through the bilateral pedicles at the T12 level. This area is not well assessed on lateral views. Lumbar spinal alignment is maintained. Vertebral body heights are maintained in the lumbar spine. L5-S1 disc space narrowing with some facet hypertrophy at L4-5 and L5-S1. IMPRESSION: Signs of potential fracture of posterior elements at T12. Consider CT assessment for further evaluation. Electronically Signed   By: Zetta Bills M.D.   On: 12/27/2018 17:07    Procedures Procedures (including critical care time)  Medications Ordered in ED Medications  oxyCODONE-acetaminophen (PERCOCET/ROXICET) 5-325 MG per tablet 1 tablet (has no administration in time range)  sodium chloride flush (NS) 0.9 % injection 3 mL (3 mLs Intravenous Given 12/27/18 1603)     Initial Impression / Assessment and Plan / ED Course  I have reviewed the triage vital signs and the nursing notes.  Pertinent labs & imaging results that were available during my care of the patient were reviewed by me and considered in my medical decision making (see chart for details).  48 year old male with lower back pain after mechanical  fall.  Neuro exam is nonfocal today he does complain of intermittent paresthesias to his left thigh.  Plain films with questionable fractures posterior elements of T12.  Will CT to further evaluate.  CT ok. Symptomatic tx. Return precautions discussed. Outpt FU otherwise.   Final Clinical Impressions(s) / ED Diagnoses   Final diagnoses:  Acute midline low back pain, unspecified whether sciatica present    ED Discharge Orders    None       Virgel Manifold, MD 01/01/19 1113

## 2018-12-27 NOTE — ED Notes (Signed)
Dr. Wilson Singer states Pt may remain off cardiac mionitor.

## 2018-12-27 NOTE — Discharge Instructions (Addendum)
Your CT scans look fine. There is not a fracture (break) there. Take the pain medicine as needed. Activity as tolerated. I expect your pain to progressively improve over the next several days to a week.

## 2018-12-27 NOTE — ED Triage Notes (Signed)
Pt reports falling 3 ft off a ladder and landing on his back. He denies LOC or hitting his head. With a hx of A.fib he felt like his heart went back into the rhythm so he took his Cardizem and Metoprolol.

## 2019-01-23 NOTE — Progress Notes (Deleted)
Cardiology Office Note:   Date:  01/23/2019  NAME:  Edward Johnson    MRN: [id number] DOB:  September 25, 1970   PCP:  Tracey Harries, MD  Cardiologist:  No primary care provider on file.  Electrophysiologist:  None   Referring MD: Tracey Harries, MD   No chief complaint on file. ***  History of Present Illness:   Edward Johnson is a 48 y.o. male with a hx of morbid obesity, paroxysmal Afib s/p DCCV, HTN who is being seen today for the evaluation of dyspnea on exertion at the request of Tracey Harries, MD.  Past Medical History: Past Medical History:  Diagnosis Date  . Atrial fibrillation (HCC)    history of paroxysmal afib, s/p TEE cardioversion (2005, 2007, 2012), and chemical cardioversion with flecainide (2005, 2011)  . Borderline blood pressure    borderline BP's during acute hospitalizations, no data from outpatient setting  . History of noncompliance with medical treatment   . Morbid obesity (HCC)     Past Surgical History: Past Surgical History:  Procedure Laterality Date  . CARDIOVERSION  01/29/2011   Procedure: CARDIOVERSION;  Surgeon: Lewayne Bunting, MD;  Location: [facility] ENDOSCOPY;  Service: Cardiovascular;  Laterality: N/A;  . CARDIOVERSION N/A 07/28/2013   Procedure: CARDIOVERSION;  Surgeon: Thurmon Fair, MD;  Location: [facility] ENDOSCOPY;  Service: Cardiovascular;  Laterality: N/A;  . TEE WITHOUT CARDIOVERSION  01/29/2011   Procedure: TRANSESOPHAGEAL ECHOCARDIOGRAM (TEE);  Surgeon: Lewayne Bunting, MD;  Location: [facility] ENDOSCOPY;  Service: Cardiovascular;  Laterality: N/A;  . TEE WITHOUT CARDIOVERSION N/A 07/28/2013   Procedure: TRANSESOPHAGEAL ECHOCARDIOGRAM (TEE);  Surgeon: Thurmon Fair, MD;  Location: [facility] ENDOSCOPY;  Service: Cardiovascular;  Laterality: N/A;  Supposed to be TEE/Cardioversion, Trish will call anesthesia in the morning.     Current Medications: No outpatient medications have been marked as taking for the 01/24/19 encounter (Appointment) with O'Neal, Ronnald Ramp,  MD.     Allergies:    Patient has no known allergies.   Social History: Social History   Socioeconomic History  . Marital status: Single    Spouse name: Not on file  . Number of children: Not on file  . Years of education: Not on file  . Highest education level: Not on file  Occupational History  . Not on file  Social Needs  . Financial resource strain: Not on file  . Food insecurity    Worry: Not on file    Inability: Not on file  . Transportation needs    Medical: Not on file    Non-medical: Not on file  Tobacco Use  . Smoking status: Never Smoker  . Smokeless tobacco: Never Used  Substance and Sexual Activity  . Alcohol use: No    Comment: OCCASIONAL  . Drug use: No  . Sexual activity: Not on file  Lifestyle  . Physical activity    Days per week: Not on file    Minutes per session: Not on file  . Stress: Not on file  Relationships  . Social Musician on phone: Not on file    Gets together: Not on file    Attends religious service: Not on file    Active member of club or organization: Not on file    Attends meetings of clubs or organizations: Not on file    Relationship status: Not on file  Other Topics Concern  . Not on file  Social History Narrative  . Not on file     Family History: The  patient's ***family history includes Cancer - Other in his mother. There is no history of CAD.  ROS:   All other ROS reviewed and negative. Pertinent positives noted in the HPI.     EKGs/Labs/Other Studies Reviewed:   The following studies were personally reviewed by me today:  EKG:  EKG is *** ordered today.  The ekg ordered today demonstrates ***, and was personally reviewed by me.   Recent Labs: 11/29/2018: ALT 61 12/27/2018: BUN 10; Creatinine, Ser 1.00; Hemoglobin 13.2; Platelets 256; Potassium 3.7; Sodium 139   Recent Lipid Panel    Component Value Date/Time   CHOL 193 01/29/2011 0602   TRIG 103 01/29/2011 0602   HDL 42 01/29/2011 0602    CHOLHDL 4.6 01/29/2011 0602   VLDL 21 01/29/2011 0602   LDLCALC 130 (H) 01/29/2011 0602    Physical Exam:   VS:  There were no vitals taken for this visit.   Wt Readings from Last 3 Encounters:  12/27/18 (!) 350 lb (158.8 kg)  11/29/18 (!) 360 lb (163.3 kg)  11/19/14 (!) 362 lb (164.2 kg)    General: Well nourished, well developed, in no acute distress Heart: Atraumatic, normal size  Eyes: PEERLA, EOMI  Neck: Supple, no JVD Endocrine: No thryomegaly Cardiac: Normal S1, S2; RRR; no murmurs, rubs, or gallops Lungs: Clear to auscultation bilaterally, no wheezing, rhonchi or rales  Abd: Soft, nontender, no hepatomegaly  Ext: No edema, pulses 2+ Musculoskeletal: No deformities, BUE and BLE strength normal and equal Skin: Warm and dry, no rashes   Neuro: Alert and oriented to person, place, time, and situation, CNII-XII grossly intact, no focal deficits  Psych: Normal mood and affect   ASSESSMENT:   Edward Johnson is a 48 y.o. male who presents for the following: No diagnosis found.  PLAN:   There are no diagnoses linked to this encounter.  Disposition: No follow-ups on file.  Medication Adjustments/Labs and Tests Ordered: Current medicines are reviewed at length with the patient today.  Concerns regarding medicines are outlined above.  No orders of the defined types were placed in this encounter.  No orders of the defined types were placed in this encounter.   There are no Patient Instructions on file for this visit.   Signed, Addison Naegeli. Audie Box, [place]  [address] [place], [place] [address] [phone]  01/23/2019 7:08 PM

## 2019-01-24 ENCOUNTER — Ambulatory Visit: Payer: 59 | Admitting: Cardiovascular Disease

## 2019-02-01 NOTE — Progress Notes (Signed)
Cardiology Office Note:   Date:  02/02/2019  NAME:  Edward Johnson    MRN: [id number] DOB:  09/29/1970   PCP:  Tracey Harries, MD  Cardiologist:  Reatha Harps, MD   Referring MD: Tracey Harries, MD   Chief Complaint  Patient presents with  . Atrial Fibrillation   History of Present Illness:   Edward Johnson is a 48 y.o. male with a hx of paroxysmal atrial fibrillation (status post cardioversion 2005, 2007, 2012, 2015), morbid obesity, hypertension, OSA who is being seen today for the evaluation of atrial fibrillation at the request of Tracey Harries, MD.  He has a long history of paroxysmal atrial fibrillation status post multiple cardioversions.  He also has significant morbid obesity with hypertension.  He reports he tested negative for sleep apnea.  He has in the process of getting approved for bariatric surgery but having to go through the process of losing weight prior to that.  He does not have definitive surgery date.  He reports his been doing well since last being seen by cardiology in 2015.  He does report 4-5 times we can get episodes of palpitations that last minutes.  They apparently are associated with bending over.  He has no limitations with activity and no symptoms of chest pain or shortness of breath when he exerts himself.  He does have back pain that limits him.  He reports that he could climb a flight of stairs but back pain limits him.  When he gets the palpitations he does get dizzy but has never passed out.  His EKG demonstrates normal sinus rhythm today.  Lab work demonstrates an LDL of 142 he had thyroid studies that were normal this year.  Past Medical History: Past Medical History:  Diagnosis Date  . Atrial fibrillation (HCC)    history of paroxysmal afib, s/p TEE cardioversion (2005, 2007, 2012), and chemical cardioversion with flecainide (2005, 2011)  . Borderline blood pressure    borderline BP's during acute hospitalizations, no data from outpatient setting  .  History of noncompliance with medical treatment   . Morbid obesity (HCC)     Past Surgical History: Past Surgical History:  Procedure Laterality Date  . CARDIOVERSION  01/29/2011   Procedure: CARDIOVERSION;  Surgeon: Lewayne Bunting, MD;  Location: [facility] ENDOSCOPY;  Service: Cardiovascular;  Laterality: N/A;  . CARDIOVERSION N/A 07/28/2013   Procedure: CARDIOVERSION;  Surgeon: Thurmon Fair, MD;  Location: [facility] ENDOSCOPY;  Service: Cardiovascular;  Laterality: N/A;  . TEE WITHOUT CARDIOVERSION  01/29/2011   Procedure: TRANSESOPHAGEAL ECHOCARDIOGRAM (TEE);  Surgeon: Lewayne Bunting, MD;  Location: [facility] ENDOSCOPY;  Service: Cardiovascular;  Laterality: N/A;  . TEE WITHOUT CARDIOVERSION N/A 07/28/2013   Procedure: TRANSESOPHAGEAL ECHOCARDIOGRAM (TEE);  Surgeon: Thurmon Fair, MD;  Location: [facility] ENDOSCOPY;  Service: Cardiovascular;  Laterality: N/A;  Supposed to be TEE/Cardioversion, Trish will call anesthesia in the morning.     Current Medications: Current Meds  Medication Sig  . acetaminophen-codeine (TYLENOL #3) 300-30 MG per tablet Take 1 tablet by mouth every 4 (four) hours as needed for moderate pain.  Marland Kitchen HYDROcodone-acetaminophen (NORCO/VICODIN) 5-325 MG tablet Take 1 tablet by mouth every 6 (six) hours as needed.  Marland Kitchen lisinopril (ZESTRIL) 20 MG tablet Take 1 tablet (20 mg total) by mouth daily.  . metoprolol succinate (TOPROL-XL) 100 MG 24 hr tablet Take 1 tablet (100 mg total) by mouth daily. NEEDS APPOINTMENT FOR FUTURE REFILLS  . rivaroxaban (XARELTO) 20 MG TABS tablet Take 1 tablet (20 mg total)  by mouth daily with supper. NEEDS APPOINTMENT FOR FUTURE REFILLS  . [DISCONTINUED] lisinopril (ZESTRIL) 20 MG tablet Take 20 mg by mouth daily.  . [DISCONTINUED] metoprolol succinate (TOPROL-XL) 25 MG 24 hr tablet Take 3 tablets (75 mg total) by mouth daily. NEEDS APPOINTMENT FOR FUTURE REFILLS     Allergies:    Patient has no known allergies.   Social History: Social History    Socioeconomic History  . Marital status: Single    Spouse name: Not on file  . Number of children: Not on file  . Years of education: Not on file  . Highest education level: Not on file  Occupational History  . Not on file  Social Needs  . Financial resource strain: Not on file  . Food insecurity    Worry: Not on file    Inability: Not on file  . Transportation needs    Medical: Not on file    Non-medical: Not on file  Tobacco Use  . Smoking status: Never Smoker  . Smokeless tobacco: Never Used  Substance and Sexual Activity  . Alcohol use: No    Comment: OCCASIONAL  . Drug use: No  . Sexual activity: Not on file  Lifestyle  . Physical activity    Days per week: Not on file    Minutes per session: Not on file  . Stress: Not on file  Relationships  . Social Musician on phone: Not on file    Gets together: Not on file    Attends religious service: Not on file    Active member of club or organization: Not on file    Attends meetings of clubs or organizations: Not on file    Relationship status: Not on file  Other Topics Concern  . Not on file  Social History Narrative  . Not on file     Family History: The patient's family history includes Cancer - Other in his mother. There is no history of CAD.  ROS:   All other ROS reviewed and negative. Pertinent positives noted in the HPI.     EKGs/Labs/Other Studies Reviewed:   The following studies were personally reviewed by me today:  EKG:  EKG is ordered today.  The ekg ordered today demonstrates normal sinus rhythm, heart rate 65, nonspecific ST-T changes, no evidence of prior infarction, and was personally reviewed by me.   Recent Labs: 11/29/2018: ALT 61 12/27/2018: BUN 10; Creatinine, Ser 1.00; Hemoglobin 13.2; Platelets 256; Potassium 3.7; Sodium 139   Recent Lipid Panel    Component Value Date/Time   CHOL 193 01/29/2011 0602   TRIG 103 01/29/2011 0602   HDL 42 01/29/2011 0602   CHOLHDL 4.6  01/29/2011 0602   VLDL 21 01/29/2011 0602   LDLCALC 130 (H) 01/29/2011 0602    Physical Exam:   VS:  BP (!) 176/106   Pulse 65   Temp (!) 96.8 F (36 C)   Ht  (1.981 m)   Wt (!) 370 lb 12.8 oz (168.2 kg)   SpO2 96%   BMI 42.85 kg/m    Wt Readings from Last 3 Encounters:  02/02/19 (!) 370 lb 12.8 oz (168.2 kg)  12/27/18 (!) 350 lb (158.8 kg)  11/29/18 (!) 360 lb (163.3 kg)    General: Obese male no acute distress Heart: Atraumatic, normal size  Eyes: PEERLA, EOMI  Neck: Supple, no JVD Endocrine: No thryomegaly Cardiac: Normal S1, S2; RRR; no murmurs, rubs, or gallops Lungs: Clear to auscultation  bilaterally, no wheezing, rhonchi or rales  Abd: Soft, nontender, no hepatomegaly  Ext: No edema, pulses 2+ Musculoskeletal: No deformities, BUE and BLE strength normal and equal Skin: Warm and dry, no rashes   Neuro: Alert and oriented to person, place, time, and situation, CNII-XII grossly intact, no focal deficits  Psych: Normal mood and affect   ASSESSMENT:   Myrtie NeitherShawn Beadles is a 48 y.o. male who presents for the following: 1. Paroxysmal atrial fibrillation (HCC)   2. Essential hypertension   3. Obesity, morbid, BMI 40.0-49.9 (HCC)     PLAN:   1. Paroxysmal atrial fibrillation (HCC) -In normal sinus rhythm today but reports palpitations and history of paroxysmal defibrillation.  I think we will repeat an echocardiogram as well as pursue a 7-day ZIO patch to see if he is having further episodes of atrial fibrillation.  He would likely be a good candidate for an ablation if needed.  However I think weight loss surgery would also take care of his atrial fibrillation.  He states has been tested for sleep apnea and has no symptoms of that.  His blood pressure is not controlled and we will get a better control at today.  He will continue his home Xarelto for now.  Should he need any surgical procedures we will be okay to hold his Xarelto for that.  He reports he is not to have  surgery anytime soon but will have it in the next year or 2.  2. Essential hypertension -We will refill his lisinopril 20 mg daily and add Norvasc 10 mg daily -We will continue his home metoprolol and increase to 100 mg succinate daily  3. Obesity, morbid, BMI 40.0-49.9 (HCC) -Counseled on importance of diet exercise and weight loss.  He is working with the Teacher, English as a foreign languagebariatric surgeon.  Likely bariatric surgery soon  Disposition: Return in about 2 months (around 04/04/2019).  Medication Adjustments/Labs and Tests Ordered: Current medicines are reviewed at length with the patient today.  Concerns regarding medicines are outlined above.  Orders Placed This Encounter  Procedures  . LONG TERM MONITOR (3-14 DAYS)  . ECHOCARDIOGRAM COMPLETE   Meds ordered this encounter  Medications  . lisinopril (ZESTRIL) 20 MG tablet    Sig: Take 1 tablet (20 mg total) by mouth daily.    Dispense:  90 tablet    Refill:  3  . metoprolol succinate (TOPROL-XL) 100 MG 24 hr tablet    Sig: Take 1 tablet (100 mg total) by mouth daily. NEEDS APPOINTMENT FOR FUTURE REFILLS    Dispense:  90 tablet    Refill:  3  . amLODipine (NORVASC) 10 MG tablet    Sig: Take 1 tablet (10 mg total) by mouth daily.    Dispense:  90 tablet    Refill:  3    Patient Instructions  Medication Instructions:  Your physician has recommended you make the following change in your medication:   INCREASE YOUR METOPROLOL SUCCINATE (TOPROL-XL)TO 100 MG BY MOUTH DAILY  START AMLODIPINE (NORVASC) 10 MG BY MOUTH DAILY  A PRESCRIPTION FOR YOUR LISINOPRIL  (ZESTRIL) HAS BEEN SENT TO YOUR PHARMACY FOR REFILL.  *If you need a refill on your cardiac medications before your next appointment, please call your pharmacy*  Lab Work: NONE If you have labs (blood work) drawn today and your tests are completely normal, you will receive your results only by: Marland Kitchen. MyChart Message (if you have MyChart) OR . A paper copy in the mail If you have any lab test  that is abnormal or we need to change your treatment, we will call you to review the results.  Testing/Procedures: Your physician has requested that you have an echocardiogram. Echocardiography is a painless test that uses sound waves to create images of your heart. It provides your doctor with information about the size and shape of your heart and how well your heart's chambers and valves are working. This procedure takes approximately one hour. There are no restrictions for this procedure. LOCATION: [facility]: [address], [place], [place] [address]   Your physician has recommended that you wear a 7 DAY ZIO-PATCH monitor. The Zio patch cardiac monitor continuously records heart rhythm data for up to 14 days, this is for patients being evaluated for multiple types heart rhythms. For the first 24 hours post application, please avoid getting the Zio monitor wet in the shower or by excessive sweating during exercise. After that, feel free to carry on with regular activities. Keep soaps and lotions away from the ZIO XT Patch.  This will be MAILED TO YOU.     ZIO XT- Long Term Monitor Instructions   Your physician has requested you wear your ZIO patch monitor 7 days.   This is a single patch monitor.  [employer] supplies one patch monitor per enrollment.  Additional stickers are not available.   Please do not apply patch if you will be having a Nuclear Stress Test, Echocardiogram, Cardiac CT, MRI, or Chest Xray during the time frame you would be wearing the monitor. The patch cannot be worn during these tests.  You cannot remove and re-apply the ZIO XT patch monitor.   Your ZIO patch monitor will be sent USPS Priority mail from [facility] directly to your home address. The monitor may also be mailed to a PO BOX if home delivery is not available.   It may take 3-5 days to receive your monitor after you have been enrolled.   Once you have  received you monitor, please review enclosed instructions.  Your monitor has already been registered assigning a specific monitor serial # to you.   Applying the monitor   Shave hair from upper left chest.   Hold abrader disc by orange tab.  Rub abrader in 40 strokes over left upper chest as indicated in your monitor instructions.   Clean area with 4 enclosed alcohol pads .  Use all pads to assure are is cleaned thoroughly.  Let dry.   Apply patch as indicated in monitor instructions.  Patch will be place under collarbone on left side of chest with arrow pointing upward.   Rub patch adhesive wings for 2 minutes.Remove white label marked "1".  Remove white label marked "2".  Rub patch adhesive wings for 2 additional minutes.   While looking in a mirror, press and release button in center of patch.  A small green light will flash 3-4 times .  This will be your only indicator the monitor has been turned on.     Do not shower for the first 24 hours.  You may shower after the first 24 hours.   Press button if you feel a symptom. You will hear a small click.  Record Date, Time and Symptom in the Patient Log Book.   When you are ready to remove patch, follow instructions on last 2 pages of Patient Log Book.  Stick patch monitor onto last page of Patient Log Book.   Place Patient Log  Book in [place] [facility] [place] box.  Use locking tab on box and tape box closed securely.  The [employer] has [employer] on it.  Please place in mailbox as soon as possible.  Your physician should have your test results approximately 7 days after the monitor has been mailed back to [facility].   Call [place] [facility] at [phone] if you have questions regarding your ZIO XT patch monitor.  Call them immediately if you see an orange light blinking on your monitor.   If your monitor falls off in less than 4 days contact our Monitor department at [phone].  If your monitor becomes loose or  falls off after 4 days call [employer] at [phone] for suggestions on securing your monitor.    Follow-Up: At [facility], you and your health needs are our priority.  As part of our continuing mission to provide you with exceptional heart care, we have created designated Provider Care Teams.  These Care Teams include your primary Cardiologist (physician) and Advanced Practice Providers (APPs -  Physician Assistants and Nurse Practitioners) who all work together to provide you with the care you need, when you need it.  Your next appointment:   2 month(s)  The format for your next appointment:   In Person  Provider:   You may see Evalina Field, MD or one of the following Advanced Practice Providers on your designated Care Team:    Almyra Deforest, PA-C  Fabian Sharp, PA-C or   Roby Lofts, PA-C      Signed, Addison Naegeli. Audie Box, [place]  [address] [place], [place] [address] [phone]  02/02/2019 3:24 PM

## 2019-02-02 ENCOUNTER — Ambulatory Visit: Payer: 59 | Admitting: Cardiovascular Disease

## 2019-02-02 ENCOUNTER — Encounter: Payer: Self-pay | Admitting: Cardiovascular Disease

## 2019-02-02 ENCOUNTER — Other Ambulatory Visit: Payer: Self-pay

## 2019-02-02 VITALS — BP 176/106 | HR 65 | Temp 96.8°F | Ht 78.0 in | Wt 370.8 lb

## 2019-02-02 DIAGNOSIS — I480 Paroxysmal atrial fibrillation: Secondary | ICD-10-CM

## 2019-02-02 DIAGNOSIS — I10 Essential (primary) hypertension: Secondary | ICD-10-CM | POA: Diagnosis not present

## 2019-02-02 MED ORDER — LISINOPRIL 20 MG PO TABS: 20.0000 mg | ORAL_TABLET | Freq: Every day | ORAL | 3 refills | Status: DC

## 2019-02-02 MED ORDER — AMLODIPINE BESYLATE 10 MG PO TABS: 10.0000 mg | ORAL_TABLET | Freq: Every day | ORAL | 3 refills | Status: DC

## 2019-02-02 MED ORDER — METOPROLOL SUCCINATE ER 100 MG PO TB24: 100.0000 mg | ORAL_TABLET | Freq: Every day | ORAL | 3 refills | Status: DC

## 2019-02-02 NOTE — Patient Instructions (Addendum)
Medication Instructions:  Your physician has recommended you make the following change in your medication:   INCREASE YOUR METOPROLOL SUCCINATE (TOPROL-XL)TO 100 MG BY MOUTH DAILY  START AMLODIPINE (NORVASC) 10 MG BY MOUTH DAILY  A PRESCRIPTION FOR YOUR LISINOPRIL  (ZESTRIL) HAS BEEN SENT TO YOUR PHARMACY FOR REFILL.  *If you need a refill on your cardiac medications before your next appointment, please call your pharmacy*  Lab Work: NONE If you have labs (blood work) drawn today and your tests are completely normal, you will receive your results only by: Marland Kitchen MyChart Message (if you have MyChart) OR . A paper copy in the mail If you have any lab test that is abnormal or we need to change your treatment, we will call you to review the results.  Testing/Procedures: Your physician has requested that you have an echocardiogram. Echocardiography is a painless test that uses sound waves to create images of your heart. It provides your doctor with information about the size and shape of your heart and how well your heart's chambers and valves are working. This procedure takes approximately one hour. There are no restrictions for this procedure. LOCATION: [facility]: [address], [place], [place] [address]   Your physician has recommended that you wear a 7 DAY ZIO-PATCH monitor. The Zio patch cardiac monitor continuously records heart rhythm data for up to 14 days, this is for patients being evaluated for multiple types heart rhythms. For the first 24 hours post application, please avoid getting the Zio monitor wet in the shower or by excessive sweating during exercise. After that, feel free to carry on with regular activities. Keep soaps and lotions away from the ZIO XT Patch.  This will be MAILED TO YOU.     ZIO XT- Long Term Monitor Instructions   Your physician has requested you wear your ZIO patch monitor 7 days.   This is a single patch  monitor.  [employer] supplies one patch monitor per enrollment.  Additional stickers are not available.   Please do not apply patch if you will be having a Nuclear Stress Test, Echocardiogram, Cardiac CT, MRI, or Chest Xray during the time frame you would be wearing the monitor. The patch cannot be worn during these tests.  You cannot remove and re-apply the ZIO XT patch monitor.   Your ZIO patch monitor will be sent USPS Priority mail from [facility] directly to your home address. The monitor may also be mailed to a PO BOX if home delivery is not available.   It may take 3-5 days to receive your monitor after you have been enrolled.   Once you have received you monitor, please review enclosed instructions.  Your monitor has already been registered assigning a specific monitor serial # to you.   Applying the monitor   Shave hair from upper left chest.   Hold abrader disc by orange tab.  Rub abrader in 40 strokes over left upper chest as indicated in your monitor instructions.   Clean area with 4 enclosed alcohol pads .  Use all pads to assure are is cleaned thoroughly.  Let dry.   Apply patch as indicated in monitor instructions.  Patch will be place under collarbone on left side of chest with arrow pointing upward.   Rub patch adhesive wings for 2 minutes.Remove white label marked "1".  Remove white label marked "2".  Rub patch adhesive wings for 2 additional minutes.   While looking  in a mirror, press and release button in center of patch.  A small green light will flash 3-4 times .  This will be your only indicator the monitor has been turned on.     Do not shower for the first 24 hours.  You may shower after the first 24 hours.   Press button if you feel a symptom. You will hear a small click.  Record Date, Time and Symptom in the Patient Log Book.   When you are ready to remove patch, follow instructions on last 2 pages of Patient Log Book.  Stick patch monitor onto last page of  Patient Log Book.   Place Patient Log Book in [place] box.  Use locking tab on box and tape box closed securely.  The [employer] and [employer] has [employer] on it.  Please place in mailbox as soon as possible.  Your physician should have your test results approximately 7 days after the monitor has been mailed back to [facility].   Call [facility] Customer Care at [phone] if you have questions regarding your ZIO XT patch monitor.  Call them immediately if you see an orange light blinking on your monitor.   If your monitor falls off in less than 4 days contact our Monitor department at [phone].  If your monitor becomes loose or falls off after 4 days call [employer] at [phone] for suggestions on securing your monitor.    Follow-Up: At [facility], you and your health needs are our priority.  As part of our continuing mission to provide you with exceptional heart care, we have created designated Provider Care Teams.  These Care Teams include your primary Cardiologist (physician) and Advanced Practice Providers (APPs -  Physician Assistants and Nurse Practitioners) who all work together to provide you with the care you need, when you need it.  Your next appointment:   2 month(s)  The format for your next appointment:   In Person  Provider:   You may see Reatha Harps, MD or one of the following Advanced Practice Providers on your designated Care Team:    Azalee Course, PA-C  Micah Flesher, PA-C or   Judy Pimple, [place]

## 2019-02-07 ENCOUNTER — Telehealth: Payer: Self-pay

## 2019-02-07 NOTE — Telephone Encounter (Signed)
7 day ZIO ordered and mailed to pt.  

## 2019-02-09 NOTE — Addendum Note (Signed)
Addended by: Ricci Barker on: 02/09/2019 10:36 AM   Modules accepted: Orders

## 2019-02-14 ENCOUNTER — Emergency Department (HOSPITAL_COMMUNITY)
Admission: EM | Admit: 2019-02-14 | Discharge: 2019-02-14 | Disposition: A | Discharge status: Home / Self Care | Payer: 59 | Attending: Emergency Medicine | Admitting: Emergency Medicine

## 2019-02-14 ENCOUNTER — Encounter (HOSPITAL_COMMUNITY): Payer: Self-pay | Admitting: Emergency Medicine

## 2019-02-14 ENCOUNTER — Other Ambulatory Visit: Payer: Self-pay

## 2019-02-14 DIAGNOSIS — Z7901 Long term (current) use of anticoagulants: Secondary | ICD-10-CM | POA: Diagnosis not present

## 2019-02-14 DIAGNOSIS — I10 Essential (primary) hypertension: Secondary | ICD-10-CM | POA: Diagnosis not present

## 2019-02-14 DIAGNOSIS — M5442 Lumbago with sciatica, left side: Secondary | ICD-10-CM | POA: Insufficient documentation

## 2019-02-14 DIAGNOSIS — Z79899 Other long term (current) drug therapy: Secondary | ICD-10-CM | POA: Diagnosis not present

## 2019-02-14 DIAGNOSIS — M545 Low back pain: Secondary | ICD-10-CM | POA: Diagnosis present

## 2019-02-14 MED ORDER — METHOCARBAMOL 500 MG PO TABS: 500.0000 mg | ORAL_TABLET | Freq: Two times a day (BID) | ORAL | 0 refills | Status: DC

## 2019-02-14 MED ORDER — OXYCODONE-ACETAMINOPHEN 5-325 MG PO TABS
1.0000 | ORAL_TABLET | Freq: Once | ORAL | Status: AC
  Administered 2019-02-14: 1 via ORAL
  Filled 2019-02-14: qty 1

## 2019-02-14 MED ORDER — LIDOCAINE 5 % EX PTCH: 1.0000 | MEDICATED_PATCH | CUTANEOUS | 0 refills | Status: DC

## 2019-02-14 NOTE — ED Triage Notes (Signed)
Pt complains of lower back pain and pain that shoots into left leg. Denies any injury. 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

## 2019-02-14 NOTE — ED Provider Notes (Signed)
[facility] Provider Note   CSN: [id number] Arrival date & time: 02/14/19  1255     History   Chief Complaint Chief Complaint  Patient presents with  . Leg Pain    HPI Edward Johnson is a 48 y.o. male.  Possible history of A. fib, blood pressure who presents for evaluation of left-sided back pain that began about 2 days ago.  He states that the pain is in the left side of his back and radiates down the posterior aspect of his left lower extremity.  He reports he will intermittently get sharp shooting pains down the posterior aspect of his left lower extremity.  He denies any preceding trauma, injury, fall.  He states he is being brought for work.  He states he still been able to ambulate and move all of his extremities without any difficulty.  He has taken Advil for pain relief with no improvement.  Patient denies any abdominal pain, nausea/vomiting, dysuria, hematuria. Denies fevers, weight loss, numbness/weakness of upper and lower extremities, bowel/bladder incontinence, saddle anesthesia, history of back surgery, history of IVDA.      The history is provided by the patient.    Past Medical History:  Diagnosis Date  . Atrial fibrillation (HCC)    history of paroxysmal afib, s/p TEE cardioversion (2005, 2007, 2012), and chemical cardioversion with flecainide (2005, 2011)  . Borderline blood pressure    borderline BP's during acute hospitalizations, no data from outpatient setting  . History of noncompliance with medical treatment   . Morbid obesity [facility](HCC)     Patient Active Problem List   Diagnosis Date Noted  . Paroxysmal A-fib (HCC) 08/02/2013  . HTN (hypertension) 08/02/2013  . Morbid obesity (HCC) 07/28/2013  . Atrial fibrillation with rapid ventricular response- TEE CV 07/28/13 01/28/2011  . History of noncompliance with medical treatment 01/28/2011  . Borderline blood pressure 01/28/2011    Past Surgical History:  Procedure  Laterality Date  . CARDIOVERSION  01/29/2011   Procedure: CARDIOVERSION;  Surgeon: Lewayne BuntingBrian S Crenshaw, MD;  [facility];  Service: Cardiovascular;  Laterality: N/A;  . CARDIOVERSION N/A 07/28/2013   Procedure: CARDIOVERSION;  Surgeon: Thurmon FairMihai Croitoru, MD;  Location: [facility] ENDOSCOPY;  Service: Cardiovascular;  Laterality: N/A;  . TEE WITHOUT CARDIOVERSION  01/29/2011   Procedure: TRANSESOPHAGEAL ECHOCARDIOGRAM (TEE);  Surgeon: Lewayne BuntingBrian S Crenshaw, MD;  [facility];  Service: Cardiovascular;  Laterality: N/A;  . TEE WITHOUT CARDIOVERSION N/A 07/28/2013   Procedure: TRANSESOPHAGEAL ECHOCARDIOGRAM (TEE);  Surgeon: Thurmon FairMihai Croitoru, MD;  [facility];  Service: Cardiovascular;  Laterality: N/A;  Supposed to be TEE/Cardioversion, Trish will call anesthesia in the morning.         Home Medications    Prior to Admission medications   Medication Sig Start Date End Date Taking? Authorizing Provider  acetaminophen-codeine (TYLENOL #3) 300-30 MG per tablet Take 1 tablet by mouth every 4 (four) hours as needed for moderate pain. 08/22/13   Quentin AngstJegede, Olugbemiga E, MD  amLODipine (NORVASC) 10 MG tablet Take 1 tablet (10 mg total) by mouth daily. 02/02/19 01/28/20  O'NealRonnald Ramp, [place] Thomas, MD  HYDROcodone-acetaminophen (NORCO/VICODIN) 5-325 MG tablet Take 1 tablet by mouth every 6 (six) hours as needed. 12/27/18   Raeford RazorKohut, Stephen, MD  lidocaine (LIDODERM) 5 % Place 1 patch onto the skin daily. Remove & Discard patch within 12 hours or as directed by MD 02/14/19   Maxwell CaulLayden, Lindsey A, PA-C  lisinopril (ZESTRIL) 20 MG tablet Take 1 tablet (20 mg total)  by mouth daily. 02/02/19   O'Neal, Cassie Freer, MD  methocarbamol (ROBAXIN) 500 MG tablet Take 1 tablet (500 mg total) by mouth 2 (two) times daily. 02/14/19   Volanda Napoleon, PA-C  metoprolol succinate (TOPROL-XL) 100 MG 24 hr tablet Take 1 tablet (100 mg total) by mouth daily. NEEDS APPOINTMENT FOR FUTURE REFILLS 02/02/19   O'Neal, Cassie Freer, MD  rivaroxaban (XARELTO) 20 MG TABS tablet Take 1 tablet (20 mg total) by mouth daily with supper. NEEDS APPOINTMENT FOR FUTURE REFILLS 08/16/14   Erlene Quan, PA-C  rivaroxaban (XARELTO) 20 MG TABS tablet Take 1 tablet (20 mg total) by mouth daily with supper. Need appointment before anymore refills Patient not taking: Reported on 02/02/2019 08/21/14   Erlene Quan, PA-C    Family History Family History  Problem Relation Age of Onset  . Cancer - Other Mother        Uterine  . CAD Neg Hx     Social History Social History   Tobacco Use  . Smoking status: Never Smoker  . Smokeless tobacco: Never Used  Substance Use Topics  . Alcohol use: No    Comment: OCCASIONAL  . Drug use: No     Allergies   Patient has no known allergies.   Review of Systems Review of Systems  Constitutional: Negative for fever.  Gastrointestinal: Negative for abdominal pain, nausea and vomiting.  Genitourinary: Negative for dysuria and hematuria.  Musculoskeletal: Positive for back pain.  Neurological: Negative for weakness, numbness and headaches.  All other systems reviewed and are negative.    Physical Exam Updated Vital Signs BP (!) 159/125 (BP Location: Left Wrist)   Pulse 68   Temp 98.4 F (36.9 C) (Oral)   Resp 16   SpO2 98%   Physical Exam Vitals signs and nursing note reviewed.  Constitutional:      Appearance: He is well-developed.  HENT:     Head: Normocephalic and atraumatic.  Eyes:     General: No scleral icterus.       Right eye: No discharge.        Left eye: No discharge.     Conjunctiva/sclera: Conjunctivae normal.  Neck:     Comments: Full flexion/extension and lateral movement of neck fully intact. No bony midline tenderness. No deformities or crepitus.  Cardiovascular:     Pulses:          Dorsalis pedis pulses are 1+ on the left side.  Pulmonary:     Effort: Pulmonary effort is normal.  Musculoskeletal:     Thoracic back: He exhibits no  tenderness.       Back:     Comments: No midline T or L spine tenderness. No deformity or crepitus.  Diffuse tenderness noted to paraspinal muscles of the lower and on the left side.  This extends into the left gluteal muscle.  Skin:    General: Skin is warm and dry.     Comments: Good distal cap refill.  LLE is not dusky in appearance or cool to touch.  Neurological:     Mental Status: He is alert.     Comments: Follows commands, Moves all extremities  5/5 strength to BUE and BLE  Sensation intact throughout all major nerve distributions Normal gait Positive SLR on left side.  Psychiatric:        Speech: Speech normal.        Behavior: Behavior normal.      ED Treatments / Results  Labs (  all labs ordered are listed, but only abnormal results are displayed) Labs Reviewed - No data to display  EKG None  Radiology No results found.  Procedures Procedures (including critical care time)  Medications Ordered in ED Medications  oxyCODONE-acetaminophen (PERCOCET/ROXICET) 5-325 MG per tablet 1 tablet (1 tablet Oral Given 02/14/19 1507)     Initial Impression / Assessment and Plan / ED Course  I have reviewed the triage vital signs and the nursing notes.  Pertinent labs & imaging results that were available during my care of the patient were reviewed by me and considered in my medical decision making (see chart for details).        48 year old male who presents for evaluation of left-sided back pain x2 days.  Reports pain radiates to the left lower extremity.  He reports a sharp shooting pain.  No preceding trauma, injury, fall. Patient is afebrile, non-toxic appearing, sitting comfortably on examination table.  He is hypertensive.  He has a history of high blood pressure.  He reports that he has not been taking his blood pressure medication.  Suspect that this is causing his blood pressure be elevated.  On exam, his tenderness palpation in left lower lumbar region extending  left lower extremity.  Consistent with sciatica.  History/physical exam not concerning for clinical spinal abscess.  History/physical exam not concerning for ischemic limb, aortic dissection.  No indication for x-ray imaging as patient has no recent trauma, injury, fall.  We will plan to treat with muscle relaxers.  Encouraged at home supportive care measures. At this time, patient exhibits no emergent life-threatening condition that require further evaluation in ED or admission. Patient had ample opportunity for questions and discussion. All patient's questions were answered with full understanding. Strict return precautions discussed. Patient expresses understanding and agreement to plan.   Portions of this note were generated with Scientist, clinical (histocompatibility and immunogenetics). Dictation errors may occur despite best attempts at proofreading.   Final Clinical Impressions(s) / ED Diagnoses   Final diagnoses:  Acute left-sided low back pain with left-sided sciatica    ED Discharge Orders         Ordered    methocarbamol (ROBAXIN) 500 MG tablet  2 times daily,   Status:  Discontinued     02/14/19 1449    lidocaine (LIDODERM) 5 %  Every 24 hours     02/14/19 1449    methocarbamol (ROBAXIN) 500 MG tablet  2 times daily     02/14/19 1450           Rosana Hoes 02/14/19 1804    Sabas Sous, MD 02/15/19 [phone]

## 2019-02-14 NOTE — ED Notes (Signed)
Patient verbalizes understanding of discharge instructions. Opportunity for questioning and answers were provided. Armband removed by staff, pt discharged from ED. Ambulated out to lobby  

## 2019-02-14 NOTE — Discharge Instructions (Addendum)
You can take Tylenol or Ibuprofen as directed for pain. You can alternate Tylenol and Ibuprofen every 4 hours. If you take Tylenol at 1pm, then you can take Ibuprofen at 5pm. Then you can take Tylenol again at 9pm.   Take Robaxin as prescribed. This medication will make you drowsy so do not drive or drink alcohol when taking it.  Use Lidoderm patches as directed.  Make sure you are taking your blood pressure medication.  Follow-up with [facility] to establish a primary care doctor if you do not have one.   Follow-up with referred outpatient orthopedic doctor.  Return to the Emergency Department immediately for any worsening back pain, neck pain, difficulty walking, numbness/weaknss of your arms or legs, urinary or bowel accidents, fever or any other worsening or concerning symptoms.

## 2019-02-18 ENCOUNTER — Other Ambulatory Visit (HOSPITAL_COMMUNITY): Payer: 59

## 2019-02-23 ENCOUNTER — Other Ambulatory Visit (INDEPENDENT_AMBULATORY_CARE_PROVIDER_SITE_OTHER): Payer: 59

## 2019-02-23 DIAGNOSIS — I480 Paroxysmal atrial fibrillation: Secondary | ICD-10-CM

## 2019-03-02 ENCOUNTER — Other Ambulatory Visit (HOSPITAL_COMMUNITY): Payer: 59

## 2019-03-09 ENCOUNTER — Other Ambulatory Visit (HOSPITAL_COMMUNITY): Payer: 59

## 2019-03-23 ENCOUNTER — Other Ambulatory Visit (HOSPITAL_COMMUNITY): Payer: 59

## 2019-03-29 ENCOUNTER — Encounter (HOSPITAL_COMMUNITY): Payer: Self-pay | Admitting: Radiology

## 2019-04-03 NOTE — Progress Notes (Deleted)
Cardiology Office Note:   Date:  04/03/2019  NAME:  Edward Johnson    MRN: [id number] DOB:  04/30/1970   PCP:  Bernerd Limbo, MD  Cardiologist:  Evalina Field, MD  Electrophysiologist:  None   Referring MD: Bernerd Limbo, MD   No chief complaint on file. ***  History of Present Illness:   Edward Johnson is a 49 y.o. male with a hx of paroxysmal atrial fibrillation, morbid obesity (BMI 43), hypertension who presents for follow-up.  Was evaluated in November for paroxysmal atrial fibrillation.  Has remained on Xarelto.  Echocardiogram and 7-day ZIO patch ordered.  He has completed none of this.  Problem List 1. Paroxysmal Atrial fibrillation  -DCCV 2005, 2007, 2012, 2015 2. Morbid Obesity (BMI 43) 3. Hypertension  4. OSA  Past Medical History: Past Medical History:  Diagnosis Date  . Atrial fibrillation ([place])    history of paroxysmal afib, s/p TEE cardioversion (2005, 2007, 2012), and chemical cardioversion with flecainide (2005, 2011)  . Borderline blood pressure    borderline BP's during acute hospitalizations, no data from outpatient setting  . History of noncompliance with medical treatment   . Morbid obesity ([place])     Past Surgical History: Past Surgical History:  Procedure Laterality Date  . CARDIOVERSION  01/29/2011   Procedure: CARDIOVERSION;  Surgeon: Lelon Perla, MD;  [facility] END[facility]COPY;  Service: Cardiovascular;  Laterality: N/A;  . CARDIOVERSION N/A 07/28/2013   Procedure: CARDIOVERSION;  Surgeon: Sanda Klein, MD;  Location: [facility] ENDOSCOPY;  Service: Cardiovascular;  Laterality: N/A;  . TEE WITHOUT CARDIOVERSION  01/29/2011   Procedure: TRANSESOPHAGEAL ECHOCARDIOGRAM (TEE);  Surgeon: Lelon Perla, MD;  Location: [facility] ENDOSCOPY;  Service: Cardiovascular;  Laterality: N/A;  . TEE WITHOUT CARDIOVERSION N/A 07/28/2013   Procedure: TRANSESOPHAGEAL ECHOCARDIOGRAM (TEE);  Surgeon: Sanda Klein, MD;  [facility] ENDOSCOPY;  Service: Cardiovascular;   Laterality: N/A;  Supposed to be TEE/Cardioversion, Trish will call anesthesia in the morning.     Current Medications: No outpatient medications have been marked as taking for the 04/04/19 encounter (Appointment) with O'Neal, Cassie Freer, MD.     Allergies:    Patient has no known allergies.   Social History: Social History   Socioeconomic History  . Marital status: Single    Spouse name: Not on file  . Number of children: Not on file  . Years of education: Not on file  . Highest education level: Not on file  Occupational History  . Not on file  Tobacco Use  . Smoking status: Never Smoker  . Smokeless tobacco: Never Used  Substance and Sexual Activity  . Alcohol use: No    Comment: OCCASIONAL  . Drug use: No  . Sexual activity: Not on file  Other Topics Concern  . Not on file  Social History Narrative  . Not on file   Social Determinants of Health   Financial Resource Strain:   . Difficulty of Paying Living Expenses: Not on file  Food Insecurity:   . Worried About Charity fundraiser in the Last Year: Not on file  . Ran Out of Food in the Last Year: Not on file  Transportation Needs:   . Lack of Transportation (Medical): Not on file  . Lack of Transportation (Non-Medical): Not on file  Physical Activity:   . Days of Exercise per Week: Not on file  . Minutes of Exercise per Session: Not on file  Stress:   . Feeling of Stress : Not on file  Social Connections:   . Frequency of Communication with Friends and Family: Not on file  . Frequency of Social Gatherings with Friends and Family: Not on file  . Attends Religious Services: Not on file  . Active Member of Clubs or Organizations: Not on file  . Attends Banker Meetings: Not on file  . Marital Status: Not on file     Family History: The patient's ***family history includes Cancer - Other in his mother. There is no history of CAD.  ROS:   All other ROS reviewed and negative. Pertinent  positives noted in the HPI.     EKGs/Labs/Other Studies Reviewed:   The following studies were personally reviewed by me today:  EKG:  EKG is *** ordered today.  The ekg ordered today demonstrates ***, and was personally reviewed by me.   Recent Labs: 11/29/2018: ALT 61 12/27/2018: BUN 10; Creatinine, Ser 1.00; Hemoglobin 13.2; Platelets 256; Potassium 3.7; Sodium 139   Recent Lipid Panel    Component Value Date/Time   CHOL 193 01/29/2011 0602   TRIG 103 01/29/2011 0602   HDL 42 01/29/2011 0602   CHOLHDL 4.6 01/29/2011 0602   VLDL 21 01/29/2011 0602   LDLCALC 130 (H) 01/29/2011 0602    Physical Exam:   VS:  There were no vitals taken for this visit.   Wt Readings from Last 3 Encounters:  02/02/19 (!) 370 lb 12.8 oz (168.2 kg)  12/27/18 (!) 350 lb (158.8 kg)  11/29/18 (!) 360 lb (163.3 kg)    General: Well nourished, well developed, in no acute distress Heart: Atraumatic, normal size  Eyes: PEERLA, EOMI  Neck: Supple, no JVD Endocrine: No thryomegaly Cardiac: Normal S1, S2; RRR; no murmurs, rubs, or gallops Lungs: Clear to auscultation bilaterally, no wheezing, rhonchi or rales  Abd: Soft, nontender, no hepatomegaly  Ext: No edema, pulses 2+ Musculoskeletal: No deformities, BUE and BLE strength normal and equal Skin: Warm and dry, no rashes   Neuro: Alert and oriented to person, place, time, and situation, CNII-XII grossly intact, no focal deficits  Psych: Normal mood and affect   ASSESSMENT:   Edward Johnson is a 49 y.o. male who presents for the following: No diagnosis found.  PLAN:   There are no diagnoses linked to this encounter.  Disposition: No follow-ups on file.  Medication Adjustments/Labs and Tests Ordered: Current medicines are reviewed at length with the patient today.  Concerns regarding medicines are outlined above.  No orders of the defined types were placed in this encounter.  No orders of the defined types were placed in this encounter.   There  are no Patient Instructions on file for this visit.   Signed, Lenna Gilford. Flora Lipps, MD [facility]  [address] [place], [place] [address] [phone]  04/03/2019 4:59 PM

## 2019-04-04 ENCOUNTER — Ambulatory Visit: Payer: 59 | Admitting: Cardiovascular Disease

## 2019-04-05 ENCOUNTER — Other Ambulatory Visit: Payer: Self-pay

## 2019-04-05 ENCOUNTER — Telehealth (HOSPITAL_COMMUNITY): Payer: Self-pay | Admitting: Radiology

## 2019-04-05 NOTE — Telephone Encounter (Signed)
Just an FYI. We have made several attempts to contact this patient including sending a letter to schedule or reschedule their echocardiogram. We will be removing the patient from the echo WQ.  1.12.21 letter mailed evd  1.6.21 no show  12.23.20 no show  12.16 patient cancelled  12.14 patient sick  Thank you

## 2019-04-05 NOTE — Telephone Encounter (Signed)
Just a heads up.  Thanks!    

## 2019-04-11 ENCOUNTER — Ambulatory Visit: Payer: 59 | Attending: Internal Medicine

## 2019-04-11 DIAGNOSIS — Z20822 Contact with and (suspected) exposure to covid-19: Secondary | ICD-10-CM

## 2019-04-12 LAB — NOVEL CORONAVIRUS, NAA: SARS-CoV-2, NAA: NOT DETECTED

## 2019-04-24 NOTE — Progress Notes (Deleted)
Cardiology Office Note:   Date:  04/24/2019  NAME:  Edward Johnson    MRN: [id number] DOB:  [birth year]   PCP:  Bernerd Limbo, MD  Cardiologist:  Evalina Field, MD  Electrophysiologist:  None   Referring MD: Bernerd Limbo, MD   No chief complaint on file. ***  History of Present Illness:   Edward Johnson is a 49 y.o. male with a hx of morbid obesity, paroxysmal atrial fibrillation status post cardioversion in the past, hypertension, OSA who presents for routine follow-up.  Recent ZIO patch without recurrence of atrial fibrillation.  He has not completed an echocardiogram.  Problem List 1. Paroxysmal Afib -CHADSVASC=1 -DCCV 2005, 2007, 2012, 2015 2. Morbid Obesity (BMI 43) 3. Hypertension  4. Sleep apnea   Past Medical History: Past Medical History:  Diagnosis Date  . Atrial fibrillation ([place])    history of paroxysmal afib, s/p TEE cardioversion (2005, 2007, 2012), and chemical cardioversion with flecainide (2005, 2011)  . Borderline blood pressure    borderline BP's during acute hospitalizations, no data from outpatient setting  . History of noncompliance with medical treatment   . Morbid obesity ([place])     Past Surgical History: Past Surgical History:  Procedure Laterality Date  . CARDIOVERSION  01/29/2011   Procedure: CARDIOVERSION;  Surgeon: Lelon Perla, MD;  Location: [facility] ENDOSCOPY;  Service: Cardiovascular;  Laterality: N/A;  . CARDIOVERSION N/A 07/28/2013   Procedure: CARDIOVERSION;  Surgeon: Sanda Klein, MD;  Location: [facility] ENDOSCOPY;  Service: Cardiovascular;  Laterality: N/A;  . TEE WITHOUT CARDIOVERSION  01/29/2011   Procedure: TRANSESOPHAGEAL ECHOCARDIOGRAM (TEE);  Surgeon: Lelon Perla, MD;  Location: [facility] ENDOSCOPY;  Service: Cardiovascular;  Laterality: N/A;  . TEE WITHOUT CARDIOVERSION N/A 07/28/2013   Procedure: TRANSESOPHAGEAL ECHOCARDIOGRAM (TEE);  Surgeon: Sanda Klein, MD;  Location: [facility] ENDOSCOPY;  Service: Cardiovascular;  Laterality: N/A;  Supposed  to be TEE/Cardioversion, Trish will call anesthesia in the morning.     Current Medications: No outpatient medications have been marked as taking for the 04/25/19 encounter (Appointment) with O'Neal, Cassie Freer, MD.     Allergies:    Patient has no known allergies.   Social History: Social History   Socioeconomic History  . Marital status: Single    Spouse name: Not on file  . Number of children: Not on file  . Years of education: Not on file  . Highest education level: Not on file  Occupational History  . Not on file  Tobacco Use  . Smoking status: Never Smoker  . Smokeless tobacco: Never Used  Substance and Sexual Activity  . Alcohol use: No    Comment: OCCASIONAL  . Drug use: No  . Sexual activity: Not on file  Other Topics Concern  . Not on file  Social History Narrative  . Not on file   Social Determinants of Health   Financial Resource Strain:   . Difficulty of Paying Living Expenses: Not on file  Food Insecurity:   . Worried About Charity fundraiser in the Last Year: Not on file  . Ran Out of Food in the Last Year: Not on file  Transportation Needs:   . Lack of Transportation (Medical): Not on file  . Lack of Transportation (Non-Medical): Not on file  Physical Activity:   . Days of Exercise per Week: Not on file  . Minutes of Exercise per Session: Not on file  Stress:   . Feeling of Stress : Not on file  Social Connections:   .  Frequency of Communication with Friends and Family: Not on file  . Frequency of Social Gatherings with Friends and Family: Not on file  . Attends Religious Services: Not on file  . Active Member of Clubs or Organizations: Not on file  . Attends Banker Meetings: Not on file  . Marital Status: Not on file     Family History: The patient's ***family history includes Cancer - Other in his mother. There is no history of CAD.  ROS:   All other ROS reviewed and negative. Pertinent positives noted in the HPI.      EKGs/Labs/Other Studies Reviewed:   The following studies were personally reviewed by me today:  EKG:  EKG is *** ordered today.  The ekg ordered today demonstrates ***, and was personally reviewed by me.   Zio 04/05/2019 Enrollment 02/23/2019-02/25/2019 (1 day 17 hours). Patient had a min HR of 49 bpm (sinus bradycardia), max HR of 115 bpm (sinus tachycardia), and avg HR of 65 bpm (normal sinus rhythm). Predominant underlying rhythm was Sinus Rhythm. No Isolated SVEs, SVE Couplets, or SVE Triplets were present. Isolated VEs were rare (<1.0%), and no VE Couplets or VE Triplets were present. Diary symptoms reported outside the wear period. There were issues with patch attachment to the patient. No atrial fibrillation.   Impression: 1. No atrial fibrillation detected.  2. Rare ectopy.   Recent Labs: 11/29/2018: ALT 61 12/27/2018: BUN 10; Creatinine, Ser 1.00; Hemoglobin 13.2; Platelets 256; Potassium 3.7; Sodium 139   Recent Lipid Panel    Component Value Date/Time   CHOL 193 01/29/2011 0602   TRIG 103 01/29/2011 0602   HDL 42 01/29/2011 0602   CHOLHDL 4.6 01/29/2011 0602   VLDL 21 01/29/2011 0602   LDLCALC 130 (H) 01/29/2011 0602    Physical Exam:   VS:  There were no vitals taken for this visit.   Wt Readings from Last 3 Encounters:  02/02/19 (!) 370 lb 12.8 oz (168.2 kg)  12/27/18 (!) 350 lb (158.8 kg)  11/29/18 (!) 360 lb (163.3 kg)    General: Well nourished, well developed, in no acute distress Heart: Atraumatic, normal size  Eyes: PEERLA, EOMI  Neck: Supple, no JVD Endocrine: No thryomegaly Cardiac: Normal S1, S2; RRR; no murmurs, rubs, or gallops Lungs: Clear to auscultation bilaterally, no wheezing, rhonchi or rales  Abd: Soft, nontender, no hepatomegaly  Ext: No edema, pulses 2+ Musculoskeletal: No deformities, BUE and BLE strength normal and equal Skin: Warm and dry, no rashes   Neuro: Alert and oriented to person, place, time, and situation, CNII-XII grossly  intact, no focal deficits  Psych: Normal mood and affect   ASSESSMENT:   Edward Johnson is a 49 y.o. male who presents for the following: No diagnosis found.  PLAN:   There are no diagnoses linked to this encounter.  Disposition: No follow-ups on file.  Medication Adjustments/Labs and Tests Ordered: Current medicines are reviewed at length with the patient today.  Concerns regarding medicines are outlined above.  No orders of the defined types were placed in this encounter.  No orders of the defined types were placed in this encounter.   There are no Patient Instructions on file for this visit.   Signed, Lenna Gilford. Flora Lipps, MD [facility]  [address], [address] [place], [place] [address] [phone]  04/24/2019 12:04 PM

## 2019-04-25 ENCOUNTER — Ambulatory Visit: Payer: 59 | Admitting: Cardiovascular Disease

## 2019-05-26 NOTE — Progress Notes (Deleted)
Cardiology Office Note:   Date:  05/26/2019  NAME:  Edward Johnson    MRN: [id number] DOB:  November 08, 1970   PCP:  Bernerd Limbo, MD  Cardiologist:  Evalina Field, MD   Referring MD: Bernerd Limbo, MD   No chief complaint on file.  History of Present Illness:   Edward Johnson is a 49 y.o. male with a hx of pAF, HTN, OSA, HLD who presents for follow-up of atrial fibrillation.   Problem List 1. Paroxysmal Afib -DCCV 2005, 2007, 2012, 2015 2. Morbid obesity 3. Hypertension  4. OSA 5. HLD  Past Medical History: Past Medical History:  Diagnosis Date  . Atrial fibrillation ([place])    history of paroxysmal afib, s/p TEE cardioversion (2005, 2007, 2012), and chemical cardioversion with flecainide (2005, 2011)  . Borderline blood pressure    borderline BP's during acute hospitalizations, no data from outpatient setting  . History of noncompliance with medical treatment   . Morbid obesity ([place])     Past Surgical History: Past Surgical History:  Procedure Laterality Date  . CARDIOVERSION  01/29/2011   Procedure: CARDIOVERSION;  Surgeon: Lelon Perla, MD;  Location: [facility] ENDOSCOPY;  Service: Cardiovascular;  Laterality: N/A;  . CARDIOVERSION N/A 07/28/2013   Procedure: CARDIOVERSION;  Surgeon: Sanda Klein, MD;  Location: [facility] ENDOSCOPY;  Service: Cardiovascular;  Laterality: N/A;  . TEE WITHOUT CARDIOVERSION  01/29/2011   Procedure: TRANSESOPHAGEAL ECHOCARDIOGRAM (TEE);  Surgeon: Lelon Perla, MD;  Location: [facility] ENDOSCOPY;  Service: Cardiovascular;  Laterality: N/A;  . TEE WITHOUT CARDIOVERSION N/A 07/28/2013   Procedure: TRANSESOPHAGEAL ECHOCARDIOGRAM (TEE);  Surgeon: Sanda Klein, MD;  Location: [facility] ENDOSCOPY;  Service: Cardiovascular;  Laterality: N/A;  Supposed to be TEE/Cardioversion, Trish will call anesthesia in the morning.     Current Medications: No outpatient medications have been marked as taking for the 05/27/19 encounter (Appointment) with O'Neal, Cassie Freer, MD.      Allergies:    Patient has no known allergies.   Social History: Social History   Socioeconomic History  . Marital status: Single    Spouse name: Not on file  . Number of children: Not on file  . Years of education: Not on file  . Highest education level: Not on file  Occupational History  . Not on file  Tobacco Use  . Smoking status: Never Smoker  . Smokeless tobacco: Never Used  Substance and Sexual Activity  . Alcohol use: No    Comment: OCCASIONAL  . Drug use: No  . Sexual activity: Not on file  Other Topics Concern  . Not on file  Social History Narrative  . Not on file   Social Determinants of Health   Financial Resource Strain:   . Difficulty of Paying Living Expenses:   Food Insecurity:   . Worried About Charity fundraiser in the Last Year:   . Arboriculturist in the Last Year:   Transportation Needs:   . Film/video editor (Medical):   Marland Kitchen Lack of Transportation (Non-Medical):   Physical Activity:   . Days of Exercise per Week:   . Minutes of Exercise per Session:   Stress:   . Feeling of Stress :   Social Connections:   . Frequency of Communication with Friends and Family:   . Frequency of Social Gatherings with Friends and Family:   . Attends Religious Services:   . Active Member of Clubs or Organizations:   . Attends Archivist Meetings:   Marland Kitchen Marital Status:  Family History: The patient's ***family history includes Cancer - Other in his mother. There is no history of CAD.  ROS:   All other ROS reviewed and negative. Pertinent positives noted in the HPI.     EKGs/Labs/Other Studies Reviewed:   The following studies were personally reviewed by me today:  EKG:  EKG is *** ordered today.  The ekg ordered today demonstrates ***, and was personally reviewed by me.   Recent Labs: 11/29/2018: ALT 61 12/27/2018: BUN 10; Creatinine, Ser 1.00; Hemoglobin 13.2; Platelets 256; Potassium 3.7; Sodium 139   Recent Lipid Panel     Component Value Date/Time   CHOL 193 01/29/2011 0602   TRIG 103 01/29/2011 0602   HDL 42 01/29/2011 0602   CHOLHDL 4.6 01/29/2011 0602   VLDL 21 01/29/2011 0602   LDLCALC 130 (H) 01/29/2011 0602    Physical Exam:   VS:  There were no vitals taken for this visit.   Wt Readings from Last 3 Encounters:  02/02/19 (!) 370 lb 12.8 oz (168.2 kg)  12/27/18 (!) 350 lb (158.8 kg)  11/29/18 (!) 360 lb (163.3 kg)    General: Well nourished, well developed, in no acute distress Heart: Atraumatic, normal size  Eyes: PEERLA, EOMI  Neck: Supple, no JVD Endocrine: No thryomegaly Cardiac: Normal S1, S2; RRR; no murmurs, rubs, or gallops Lungs: Clear to auscultation bilaterally, no wheezing, rhonchi or rales  Abd: Soft, nontender, no hepatomegaly  Ext: No edema, pulses 2+ Musculoskeletal: No deformities, BUE and BLE strength normal and equal Skin: Warm and dry, no rashes   Neuro: Alert and oriented to person, place, time, and situation, CNII-XII grossly intact, no focal deficits  Psych: Normal mood and affect   ASSESSMENT:   Edward Johnson is a 50 y.o. male who presents for the following: 1. Paroxysmal atrial fibrillation (HCC)   2. Essential hypertension   3. Obesity, morbid, BMI 40.0-49.9 (HCC)     PLAN:   There are no diagnoses linked to this encounter.  Disposition: No follow-ups on file.  Medication Adjustments/Labs and Tests Ordered: Current medicines are reviewed at length with the patient today.  Concerns regarding medicines are outlined above.  No orders of the defined types were placed in this encounter.  No orders of the defined types were placed in this encounter.   There are no Patient Instructions on file for this visit.   Time Spent with Patient: I have spent a total of *** minutes with patient reviewing hospital notes, telemetry, EKGs, labs and examining the patient as well as establishing an assessment and plan that was discussed with the patient.  > 50% of time was  spent in direct patient care.  Signed, Lenna Gilford. Flora Lipps, MD [facility]  [address], [address] [place], [place] [address] [phone]  05/26/2019 8:09 AM

## 2019-05-27 ENCOUNTER — Ambulatory Visit: Payer: 59 | Admitting: Cardiovascular Disease

## 2019-05-30 ENCOUNTER — Encounter: Payer: Self-pay | Admitting: Cardiovascular Disease

## 2019-05-31 ENCOUNTER — Telehealth: Payer: Self-pay | Admitting: Cardiovascular Disease

## 2019-05-31 NOTE — Telephone Encounter (Signed)
Lett message 05/27/19 and 05/31/19 requesting patient to call regarding appointment change for 06/02/19

## 2019-06-02 ENCOUNTER — Ambulatory Visit: Payer: 59 | Admitting: Cardiovascular Disease

## 2019-06-02 ENCOUNTER — Encounter: Payer: Self-pay | Admitting: Physician Assistant

## 2019-06-02 ENCOUNTER — Telehealth (INDEPENDENT_AMBULATORY_CARE_PROVIDER_SITE_OTHER): Payer: 59 | Admitting: Physician Assistant

## 2019-06-02 ENCOUNTER — Telehealth: Payer: Self-pay

## 2019-06-02 VITALS — Ht 78.0 in | Wt 365.0 lb

## 2019-06-02 DIAGNOSIS — I480 Paroxysmal atrial fibrillation: Secondary | ICD-10-CM

## 2019-06-02 DIAGNOSIS — I10 Essential (primary) hypertension: Secondary | ICD-10-CM | POA: Diagnosis not present

## 2019-06-02 DIAGNOSIS — E669 Obesity, unspecified: Secondary | ICD-10-CM

## 2019-06-02 DIAGNOSIS — Z6841 Body Mass Index (BMI) 40.0 and over, adult: Secondary | ICD-10-CM

## 2019-06-02 MED ORDER — AMLODIPINE BESYLATE 5 MG PO TABS: 5.0000 mg | ORAL_TABLET | Freq: Every day | ORAL | 1 refills | Status: DC

## 2019-06-02 NOTE — Progress Notes (Signed)
Virtual Visit via Telephone Note   This visit type was conducted due to national recommendations for restrictions regarding the COVID-19 Pandemic (e.g. social distancing) in an effort to limit this patient's exposure and mitigate transmission in our community.  Due to his co-morbid illnesses, this patient is at least at moderate risk for complications without adequate follow up.  This format is felt to be most appropriate for this patient at this time.  The patient did not have access to video technology/had technical difficulties with video requiring transitioning to audio format only (telephone).  All issues noted in this document were discussed and addressed.  No physical exam could be performed with this format.  Please refer to the patient's chart for his  consent to telehealth for [facility].   The patient was identified using 2 identifiers.  Date:  06/02/2019   ID:  Edward Johnson, DOB Jan 17, 1971, MRN [id number]  Patient Location: Home Provider Location: Home  PCP:  Tracey Harries, MD  Cardiologist:  Reatha Harps, MD  Electrophysiologist:  None   Evaluation Performed:  Follow-Up Visit  Chief Complaint:  followup  History of Present Illness:    Edward Johnson is a 49 y.o. male with PMH of PAF s/p multiple cardioversion, morbid obesity, HTN, and OSA.  Patient was last seen by Dr. Flora Lipps on 02/02/2019 at which time he was maintaining sinus rhythm, however complained of palpitation.  Dr. Flora Lipps did agree with his plan to proceed with bariatric surgery.  He also recommended echocardiogram and a 7-day Zio patch monitor.  Norvasc was added to his lisinopril to help control blood pressure better.  Metoprolol succinate was also increased to 100 mg daily.  Heart monitor obtained in January showed no sign of atrial fibrillation.  It does not appear echocardiogram was ever done.  Patient presented to virtual visit today. He still occasionally notice palpitation especially during physical  exertion, however heart rate quickly goes back to normal. Some of this could be related to tachycardia associated with physical activity given his body habitus. He says after he started on the Norvasc 10 mg, it was making him jittery, and now he is afraid to take it. I recommend him to reduce the Norvasc to 5 mg as a trial. He does not have any blood pressure cuff at home to check his blood pressure, we will try to get him a blood pressure cuff at home so he can monitor it more closely. I also recommend him to have the echocardiogram in the near future. Otherwise he is doing well from cardiology perspective and he can follow-up with Dr. Flora Lipps in 3 to 4 months.  The patient does not have symptoms concerning for COVID-19 infection (fever, chills, cough, or new shortness of breath).    Past Medical History:  Diagnosis Date  . Atrial fibrillation ([facility])    history of paroxysmal afib, s/p TEE cardioversion (2005, 2007, 2012), and chemical cardioversion with flecainide (2005, 2011)  . Borderline blood pressure    borderline BP's during acute hospitalizations, no data from outpatient setting  . History of noncompliance with medical treatment   . Morbid obesity ([facility])    Past Surgical History:  Procedure Laterality Date  . CARDIOVERSION  01/29/2011   Procedure: CARDIOVERSION;  Surgeon: Lewayne Bunting, MD;  Location: [facility] ENDOSCOPY;  Service: Cardiovascular;  Laterality: N/A;  . CARDIOVERSION N/A 07/28/2013   Procedure: CARDIOVERSION;  Surgeon: Thurmon Fair, MD;  Location: [facility] ENDOSCOPY;  Service: Cardiovascular;  Laterality: N/A;  . TEE WITHOUT  CARDIOVERSION  01/29/2011   Procedure: TRANSESOPHAGEAL ECHOCARDIOGRAM (TEE);  Surgeon: Lelon Perla, MD;  Location: [facility] ENDOSCOPY;  Service: Cardiovascular;  Laterality: N/A;  . TEE WITHOUT CARDIOVERSION N/A 07/28/2013   Procedure: TRANSESOPHAGEAL ECHOCARDIOGRAM (TEE);  Surgeon: Sanda Klein, MD;  Location: [facility] ENDOSCOPY;  Service: Cardiovascular;   Laterality: N/A;  Supposed to be TEE/Cardioversion, Trish will call anesthesia in the morning.      Current Meds  Medication Sig  . acetaminophen-codeine (TYLENOL #3) 300-30 MG per tablet Take 1 tablet by mouth every 4 (four) hours as needed for moderate pain.  Marland Kitchen amLODipine (NORVASC) 10 MG tablet Take 1 tablet (10 mg total) by mouth daily.  Marland Kitchen HYDROcodone-acetaminophen (NORCO/VICODIN) 5-325 MG tablet Take 1 tablet by mouth every 6 (six) hours as needed.  Marland Kitchen lisinopril (ZESTRIL) 20 MG tablet Take 1 tablet (20 mg total) by mouth daily.  . methocarbamol (ROBAXIN) 500 MG tablet Take 1 tablet (500 mg total) by mouth 2 (two) times daily.  . metoprolol succinate (TOPROL-XL) 100 MG 24 hr tablet Take 1 tablet (100 mg total) by mouth daily. NEEDS APPOINTMENT FOR FUTURE REFILLS  . rivaroxaban (XARELTO) 20 MG TABS tablet Take 1 tablet (20 mg total) by mouth daily with supper. NEEDS APPOINTMENT FOR FUTURE REFILLS     Allergies:   Patient has no known allergies.   Social History   Tobacco Use  . Smoking status: Never Smoker  . Smokeless tobacco: Never Used  Substance Use Topics  . Alcohol use: No    Comment: OCCASIONAL  . Drug use: No     Family Hx: The patient's family history includes Cancer - Other in his mother. There is no history of CAD.  ROS:   Please see the history of present illness.     All other systems reviewed and are negative.   Prior CV studies:   The following studies were reviewed today:  Monitor 03/2019 Enrollment 02/23/2019-02/25/2019 (1 day 17 hours). Patient had a min HR of 49 bpm (sinus bradycardia), max HR of 115 bpm (sinus tachycardia), and avg HR of 65 bpm (normal sinus rhythm). Predominant underlying rhythm was Sinus Rhythm. No Isolated SVEs, SVE Couplets, or SVE Triplets were present. Isolated VEs were rare (<1.0%), and no VE Couplets or VE Triplets were present. Diary symptoms reported outside the wear period. There were issues with patch attachment to the patient.  No atrial fibrillation.   Impression: 1. No atrial fibrillation detected.  2. Rare ectopy.   Labs/Other Tests and Data Reviewed:    EKG:  An ECG dated 02/09/2019 was personally reviewed today and demonstrated:  Normal sinus rhythm without significant ST-T wave changes  Recent Labs: 11/29/2018: ALT 61 12/27/2018: BUN 10; Creatinine, Ser 1.00; Hemoglobin 13.2; Platelets 256; Potassium 3.7; Sodium 139   Recent Lipid Panel Lab Results  Component Value Date/Time   CHOL 193 01/29/2011 06:02 AM   TRIG 103 01/29/2011 06:02 AM   HDL 42 01/29/2011 06:02 AM   CHOLHDL 4.6 01/29/2011 06:02 AM   LDLCALC 130 (H) 01/29/2011 06:02 AM    Wt Readings from Last 3 Encounters:  06/02/19 (!) 365 lb (165.6 kg)  02/02/19 (!) 370 lb 12.8 oz (168.2 kg)  12/27/18 (!) 350 lb (158.8 kg)     Objective:    Vital Signs:  Ht 6\' 6"  (1.981 m)   Wt (!) 365 lb (165.6 kg)   BMI 42.18 kg/m    VITAL SIGNS:  reviewed  ASSESSMENT & PLAN:    1. PAF: On Xarelto and  metoprolol. Although he does have tachycardia palpitation during exercise, however previous heart monitor did not detect any recurrence of atrial fibrillation. Tachycardia palpitation he felt likely related to atrial tachycardia in the setting of physical exertion and morbid obesity.  2. Hypertension: Amlodipine was added to his medical regimen and metoprolol was increased during the previous office visit. However after addition of amlodipine 10 mg daily, he has been noticing jittery sensation and has been afraid to take the medication. I recommend him to try the 5 mg daily of amlodipine. We will get him a blood pressure cuff so he can check his blood pressure regularly at home  3. Morbid obesity: Recommend proceeding with bariatric surgery once ready.  COVID-19 Education: The signs and symptoms of COVID-19 were discussed with the patient and how to seek care for testing (follow up with PCP or arrange E-visit).  The importance of social distancing was  discussed today.  Time:   Today, I have spent 9 minutes with the patient with telehealth technology discussing the above problems.     Medication Adjustments/Labs and Tests Ordered: Current medicines are reviewed at length with the patient today.  Concerns regarding medicines are outlined above.   Tests Ordered: No orders of the defined types were placed in this encounter.   Medication Changes: No orders of the defined types were placed in this encounter.   Follow Up:  Either In Person or Virtual in 3 month(s)  Signed, Azalee Course, [place]  06/02/2019 10:02 AM    [facility]

## 2019-06-02 NOTE — Patient Instructions (Addendum)
Medication Instructions:   DECREASE Amlodipine to 5 mg daily  *If you need a refill on your cardiac medications before your next appointment, please call your pharmacy*  Lab Work: NONE ordered at this time of appointment   If you have labs (blood work) drawn today and your tests are completely normal, you will receive your results only by: Marland Kitchen MyChart Message (if you have MyChart) OR . A paper copy in the mail If you have any lab test that is abnormal or we need to change your treatment, we will call you to review the results.  Testing/Procedures: Your physician has requested that you have an echocardiogram. Echocardiography is a painless test that uses sound waves to create images of your heart. It provides your doctor with information about the size and shape of your heart and how well your heart's chambers and valves are working. This procedure takes approximately one hour. There are no restrictions for this procedure. This test is done at [address]   PLEASE SCHEDULE BETWEEN NOW AND 1 MONTH  Follow-Up: At [facility], you and your health needs are our priority.  As part of our continuing mission to provide you with exceptional heart care, we have created designated Provider Care Teams.  These Care Teams include your primary Cardiologist (physician) and Advanced Practice Providers (APPs -  Physician Assistants and Nurse Practitioners) who all work together to provide you with the care you need, when you need it.  Your next appointment:   3-4 month(s)  The format for your next appointment:   In Person  Provider:   Lennie Odor, MD  Other Instructions

## 2019-06-02 NOTE — Telephone Encounter (Signed)
Left detailed voice messages for Edward Johnson on his home and mobile numbers listed about his 9:45 virtual appointment with Azalee Course, PA-C and that I was calling to go over his medications and get his vital signs if possible.

## 2019-06-06 ENCOUNTER — Telehealth: Payer: Self-pay | Admitting: Licensed Clinical Social Worker

## 2019-06-06 NOTE — Telephone Encounter (Signed)
CSW referred to assist patient with obtaining a BP cuff. CSW contacted patient to inform cuff will be delivered to home. Patient grateful for support and assistance. CSW available as needed. Jackie Brennan, LCSW, CCSW-MCS [phone]  

## 2019-06-20 ENCOUNTER — Ambulatory Visit (HOSPITAL_COMMUNITY): Payer: 59 | Attending: Cardiovascular Disease

## 2019-06-20 ENCOUNTER — Other Ambulatory Visit: Payer: Self-pay

## 2019-06-20 DIAGNOSIS — I480 Paroxysmal atrial fibrillation: Secondary | ICD-10-CM | POA: Diagnosis present

## 2019-06-23 NOTE — Progress Notes (Signed)
Normal pumping function of heart, no significant valve issue

## 2019-06-24 ENCOUNTER — Telehealth: Payer: Self-pay

## 2019-06-24 NOTE — Telephone Encounter (Addendum)
Left a voice maessage for the patient to give our office a call back for the results of his Echo.  ----- Message from Azalee Course, [place] sent at 06/23/2019  4:14 PM EDT ----- Normal pumping function of heart, no significant valve issue

## 2019-06-28 ENCOUNTER — Telehealth: Payer: Self-pay | Admitting: Cardiovascular Disease

## 2019-06-28 NOTE — Telephone Encounter (Signed)
Patient calling for echo results 

## 2019-06-28 NOTE — Telephone Encounter (Signed)
Patient called w/results °

## 2019-08-09 ENCOUNTER — Telehealth: Payer: Self-pay | Admitting: Physician Assistant

## 2019-08-09 NOTE — Telephone Encounter (Signed)
Contact patient 08/09/19 to schedule appt, no answer left message

## 2019-08-22 ENCOUNTER — Other Ambulatory Visit: Payer: 59

## 2019-10-19 ENCOUNTER — Telehealth: Payer: Self-pay | Admitting: Physician Assistant

## 2019-10-19 NOTE — Telephone Encounter (Signed)
LVM for patient to return call to get follow up scheduled with Lisabeth Devoid from recall list

## 2019-11-08 ENCOUNTER — Other Ambulatory Visit: Payer: Self-pay | Admitting: Physician Assistant

## 2019-11-08 DIAGNOSIS — R7401 Elevation of levels of liver transaminase levels: Secondary | ICD-10-CM

## 2019-12-01 IMAGING — CT CT T SPINE W/O CM
3 of 6 series · 9 of 33 positions shown, 10 images · non-contrast
Comparison: Lumbar radiographs dated 12/27/2018

CLINICAL DATA: Back pain after falling off of a ladder yesterday.

EXAM:
CT THORACIC AND LUMBAR SPINE WITHOUT CONTRAST
TECHNIQUE: Multidetector CT imaging of the thoracic and lumbar spine was
performed without contrast. Multiplanar CT image reconstructions
were also generated.

[Series 8: cor bone · coronal · 0.35mm/px · 1 of 91 slices shown]
[im 46/91  bone]
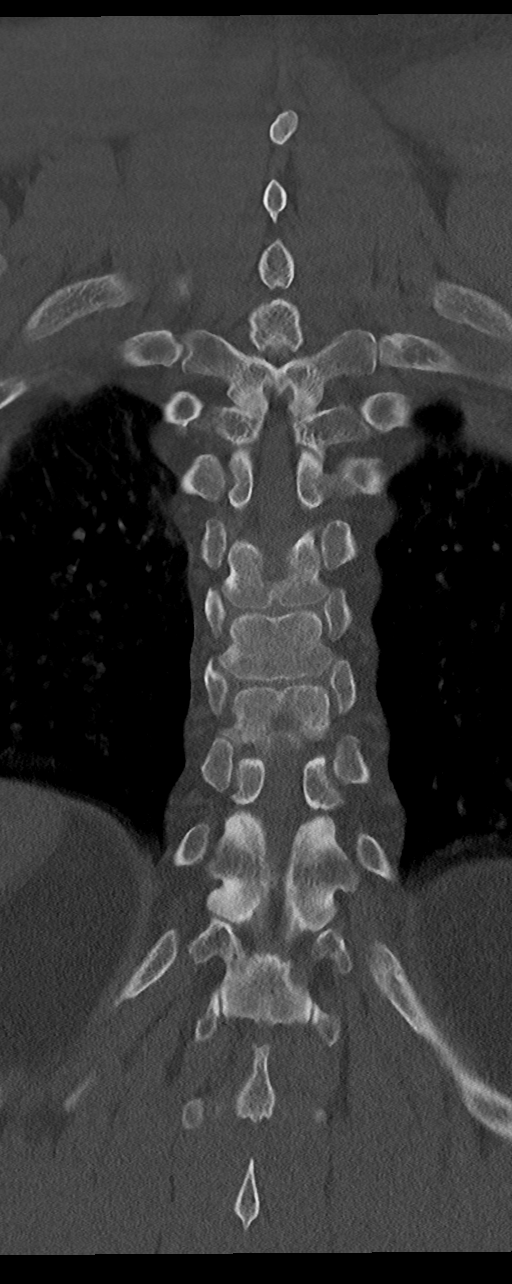

[Series 9: orthogonal bone · axial · 0.21mm/px · z∈[+4,+266]mm · 3 of 220 slices shown, 4 images]
[im 37/220  soft-tissue]
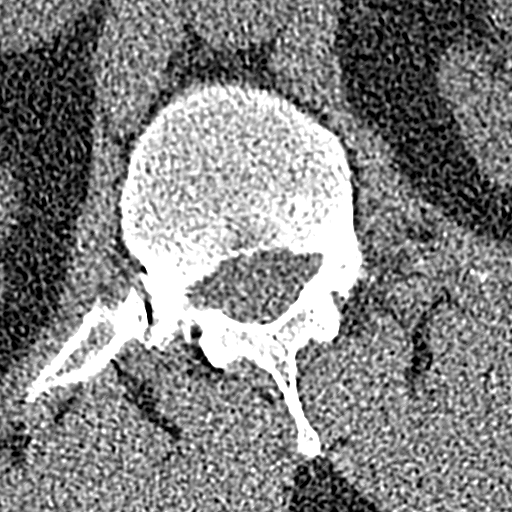
[im 37/220  bone]
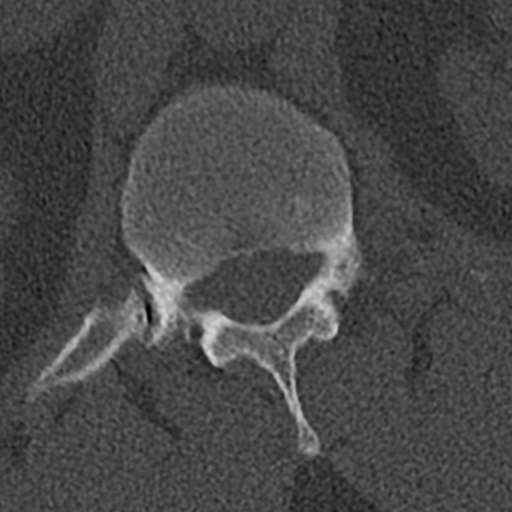
[im 110/220  bone]
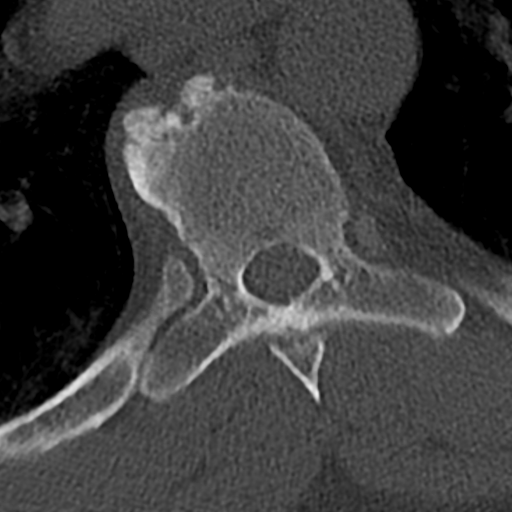
[im 183/220  bone]
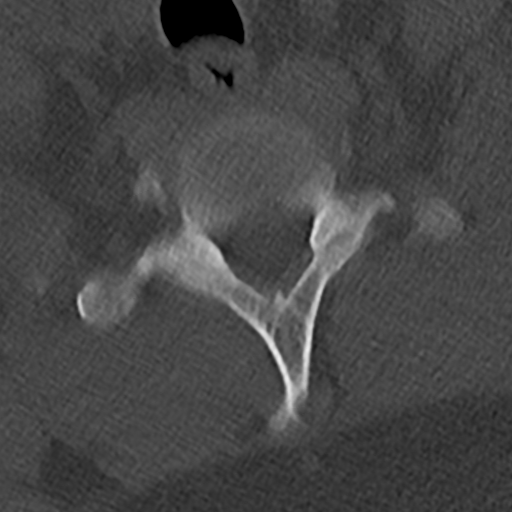

[Series 11: sag st · sagittal · 0.34mm/px · 5 of 100 slices shown]
[im 17/100  bone]
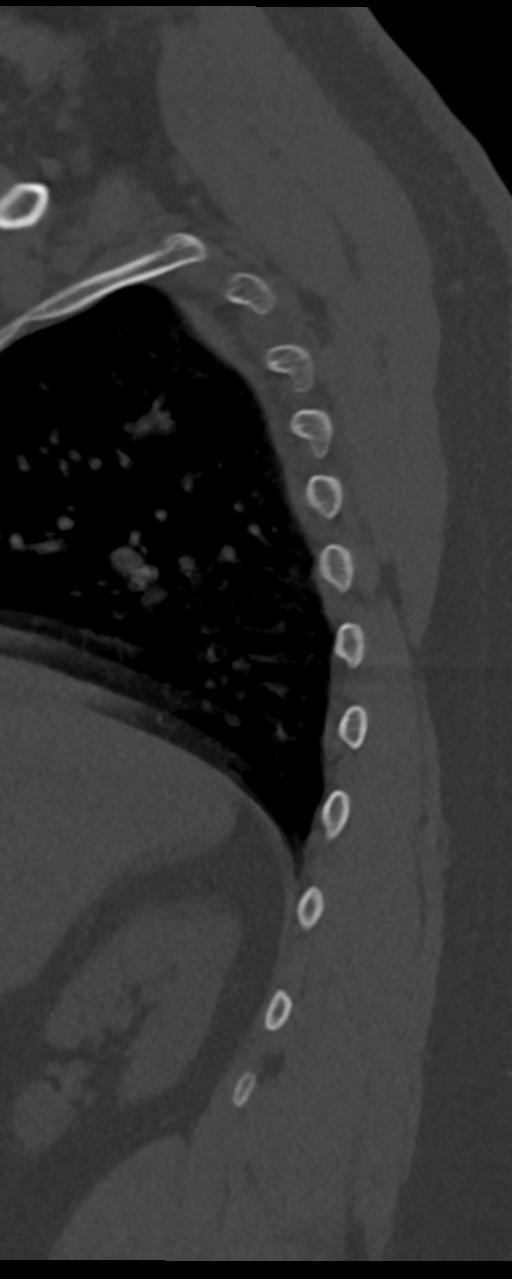
[im 34/100  bone]
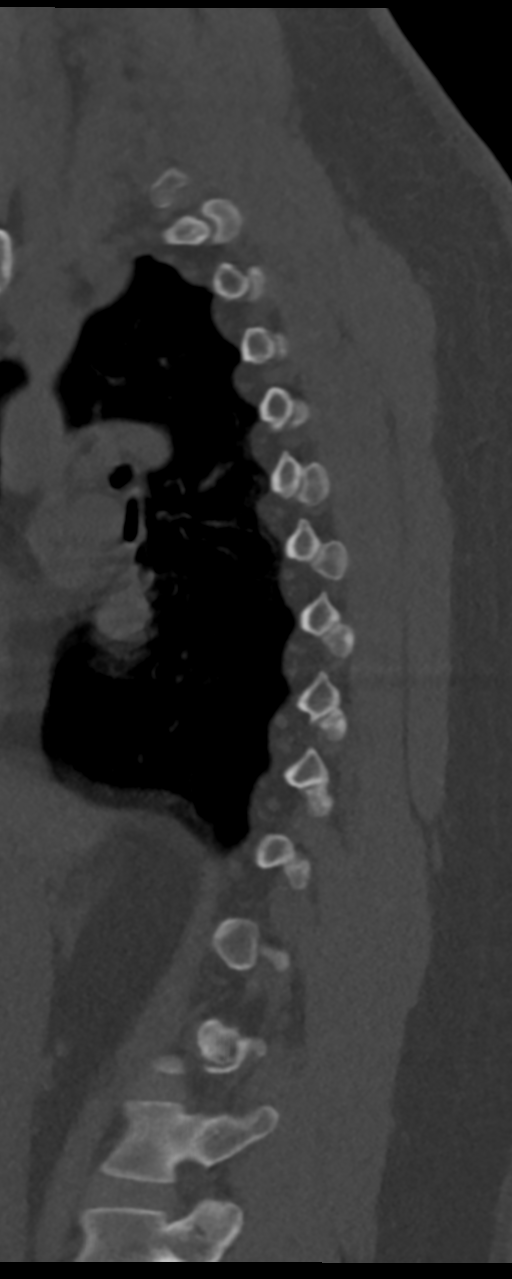
[im 50/100  bone]
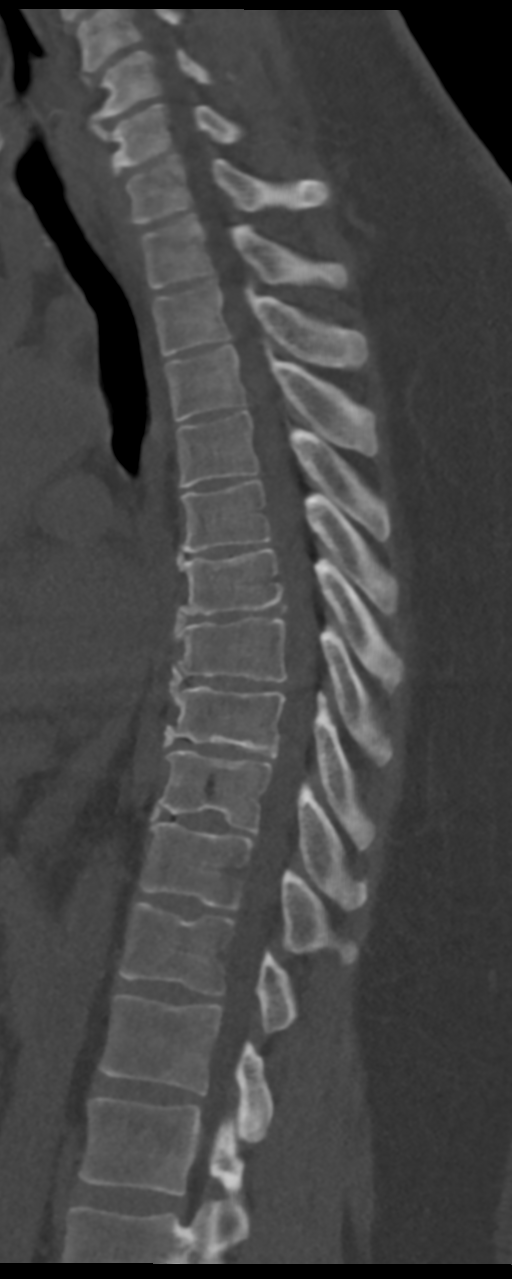
[im 67/100  bone]
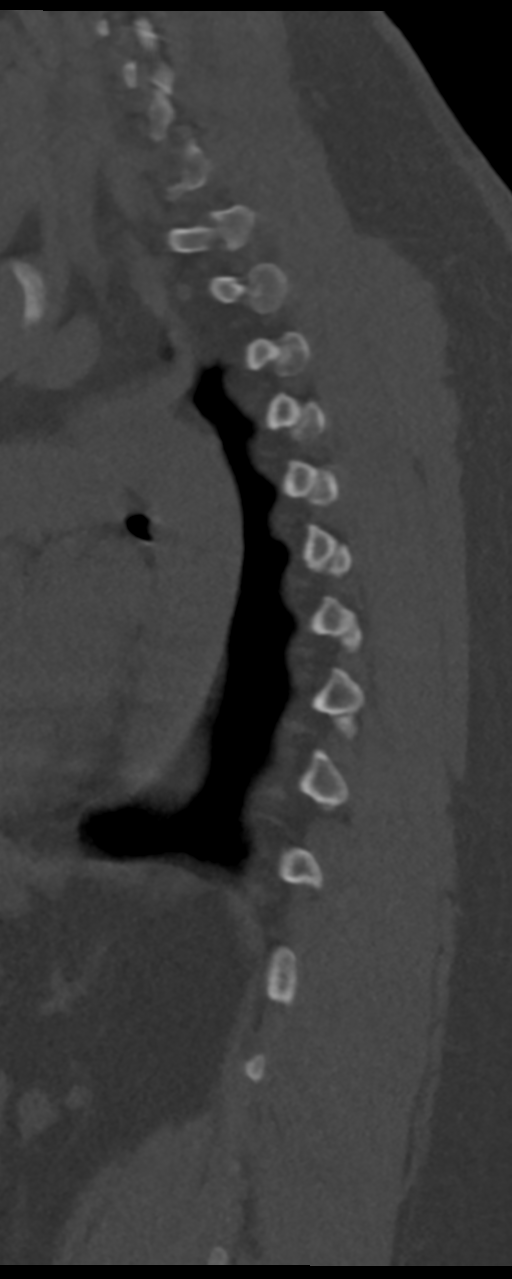
[im 83/100  bone]
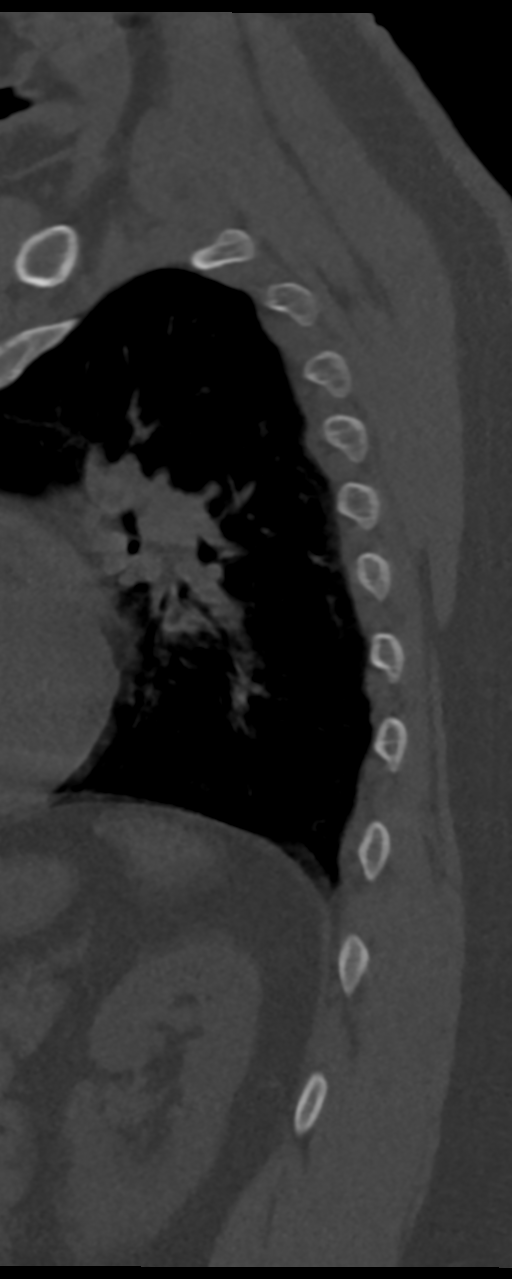

[9 of 33 positions shown; findings below may reference images not displayed]

FINDINGS: CT THORACIC SPINE FINDINGS

Alignment: Normal.

Vertebrae: No fracture or bone destruction. Osteophytes fuse the
T6-7 and T7-8 levels. Specifically, there is no evidence of fracture
of T12 as question on the lumbar radiographs of 12/27/2018.

Paraspinal and other soft tissues: Negative.

Disc levels: No visible disc protrusions or significant disc bulges.
No spinal or foraminal stenosis or facet arthritis.

CT LUMBAR SPINE FINDINGS

Segmentation: The L5 segment is sacralized with a vestigial disc at
L5-S1.

Alignment: Normal.

Vertebrae: No fracture or other acute abnormality.

Paraspinal and other soft tissues: Normal.

Disc levels: T12-L1 through L3-4: Negative.

L4-5: Degenerative disc disease with a vacuum phenomenon and slight
disc space narrowing. Central disc protrusion with accompanying
osteophytes slightly compresses the thecal sac fairly symmetrically
without focal neural impingement. No foraminal stenosis.

L5-S1: L5 is congenitally fused with the sacrum. Vestigial disc. No
neural impingement.
IMPRESSION: CT THORACIC SPINE IMPRESSION

No significant abnormality of the thoracic spine. Specifically, the
T12 vertebral body and posterior elements are normal. A

CT LUMBAR SPINE IMPRESSION

No acute abnormality of the lumbar spine. Degenerative disc disease
at L4-5 with a central disc protrusion without neural impingement.

## 2020-03-08 ENCOUNTER — Ambulatory Visit
Admission: RE | Admit: 2020-03-08 | Discharge: 2020-03-08 | Disposition: A | Discharge status: Home / Self Care | Payer: 59 | Source: Ambulatory Visit | Attending: Physician Assistant | Admitting: Physician Assistant

## 2020-03-08 ENCOUNTER — Other Ambulatory Visit: Payer: Self-pay | Admitting: Physician Assistant

## 2020-03-08 DIAGNOSIS — M25511 Pain in right shoulder: Secondary | ICD-10-CM

## 2020-06-27 ENCOUNTER — Telehealth: Payer: Self-pay | Admitting: *Deleted

## 2020-06-27 NOTE — Telephone Encounter (Signed)
   Name: Edward Johnson  DOB: 1970/07/08  MRN: [id number]  Primary Cardiologist: Reatha Harps, MD  Chart reviewed as part of pre-operative protocol coverage. Because of Edward Johnson's past medical history and time since last visit, he will require a follow-up visit in order to better assess preoperative cardiovascular risk.  Pre-op covering staff: - Please schedule appointment and call patient to inform them. If patient already had an upcoming appointment within acceptable timeframe, please add "pre-op clearance" to the appointment notes so provider is aware. - Please contact requesting surgeon's office via preferred method (i.e, phone, fax) to inform them of need for appointment prior to surgery.  If applicable, this message will also be routed to pharmacy pool and/or primary cardiologist for input on holding anticoagulant/antiplatelet agent as requested below so that this information is available to the clearing provider at time of patient's appointment.   Roe Rutherford Runell Kovich, PA  06/27/2020, 3:35 PM

## 2020-06-27 NOTE — Telephone Encounter (Signed)
   [facility] Pre-operative Risk Assessment    Patient Name: Edward Johnson  DOB: 08/28/1970  MRN: [id number]   HEARTCARE STAFF: - Please ensure there is not already an duplicate clearance open for this procedure. - Under Visit Info/Reason for Call, type in Other and utilize the format Clearance MM/DD/YY or Clearance TBD. Do not use dashes or single digits. - If request is for dental extraction, please clarify the # of teeth to be extracted.  Request for surgical clearance:  1. What type of surgery is being performed? RIGHT SHOULDER SCOPE CUFF REPAIR   2. When is this surgery scheduled? 07/23/20   3. What type of clearance is required (medical clearance vs. Pharmacy clearance to hold med vs. Both)? BOTH  4. Are there any medications that need to be held prior to surgery and how long? XARELTO; NOTES ON CLEARANCE REQUEST STATES THE PT STATED TO THEIR OFFICE THAT HE IS NOT ON ANY BLOOD THINNERS  5. Practice name and name of physician performing surgery? [facility]; DR. Remo Lipps NORRIS   6. What is the office phone number? [phone]   7.   What is the office fax number? [phone] ATTN: ASHLEY HILTON  8.   Anesthesia type (None, local, MAC, general) ? NOT LISTED   Julaine Hua 06/27/2020, 12:48 PM  _________________________________________________________________   (provider comments below)

## 2020-06-27 NOTE — Telephone Encounter (Signed)
Left message for the pt to call the office to schedule a pre op appt. See FYI note on pt's chart ( Due to multiple cancellations and No Show's ---Do not schedule this patient with Dr. Flora Lipps (per Dr. Flora Lipps).  See if we can have the pt see APP for pre op clearance.

## 2020-06-29 NOTE — Telephone Encounter (Signed)
Left message x 2 for pt to call the office to schedule a pre op appt. Please see previous notes about any appts with Dr. Flora Lipps. Pt will need to be scheduled with APP. Unfortunately the first appt I saw was on 08/03/20 with Joni Reining, DNP. Surgery is set for 07/23/20 , which is may need to be post-poned.

## 2020-07-02 NOTE — Telephone Encounter (Signed)
Left message for pt to call the office for appt. First available I see now if 08/10/20 with Joni Reining , DNP. I will send notes to surgeon's office as we have been unsuccessful in reaching pt to schedule an appt.

## 2020-07-04 ENCOUNTER — Encounter: Payer: Self-pay | Admitting: *Deleted

## 2020-07-04 NOTE — Telephone Encounter (Signed)
Letter sent thru MY CHART to pt to call the office to schedule a pre op appt. Have attempted to reach pt x 3 without success. Will remove from the pre op call back pool.

## 2021-03-13 ENCOUNTER — Ambulatory Visit (HOSPITAL_COMMUNITY)
Admission: EM | Admit: 2021-03-13 | Discharge: 2021-03-13 | Disposition: A | Discharge status: Home / Self Care | Payer: 59 | Attending: Urgent Care | Admitting: Urgent Care

## 2021-03-13 ENCOUNTER — Other Ambulatory Visit: Payer: Self-pay

## 2021-03-13 ENCOUNTER — Encounter (HOSPITAL_COMMUNITY): Payer: Self-pay | Admitting: Emergency Medicine

## 2021-03-13 DIAGNOSIS — B349 Viral infection, unspecified: Secondary | ICD-10-CM | POA: Diagnosis not present

## 2021-03-13 DIAGNOSIS — I10 Essential (primary) hypertension: Secondary | ICD-10-CM | POA: Diagnosis not present

## 2021-03-13 DIAGNOSIS — I2510 Atherosclerotic heart disease of native coronary artery without angina pectoris: Secondary | ICD-10-CM | POA: Insufficient documentation

## 2021-03-13 DIAGNOSIS — Z20822 Contact with and (suspected) exposure to covid-19: Secondary | ICD-10-CM | POA: Diagnosis not present

## 2021-03-13 DIAGNOSIS — M791 Myalgia, unspecified site: Secondary | ICD-10-CM | POA: Diagnosis not present

## 2021-03-13 DIAGNOSIS — J029 Acute pharyngitis, unspecified: Secondary | ICD-10-CM | POA: Diagnosis present

## 2021-03-13 LAB — POC INFLUENZA A AND B ANTIGEN (URGENT CARE ONLY)
INFLUENZA A ANTIGEN, POC: NEGATIVE
INFLUENZA B ANTIGEN, POC: NEGATIVE

## 2021-03-13 NOTE — ED Provider Notes (Addendum)
[facility]    CSN: [id number] Arrival date & time: 03/13/21  1034      History   Chief Complaint Chief Complaint  Patient presents with   Sore Throat    HPI Katie Moch is a 50 y.o. male.   50 year old male presents today with an acute onset of cough, nasal congestion, sore throat, and severe body aches.  He states Monday evening he had a fever of 101 T-max.  He has been taking over-the-counter DayQuil.  His main concern is the severe myalgias.  He denies shortness of breath or chest pain. Does have hx of cardiovascular disease but denies any acute respiratory or cardiac symptoms. Has not taken BP medications today. Not been vaccinated against the flu   Sore Throat   Past Medical History:  Diagnosis Date   Atrial fibrillation (HCC)    history of paroxysmal afib, s/p TEE cardioversion (2005, 2007, 2012), and chemical cardioversion with flecainide (2005, 2011)   Borderline blood pressure    borderline BP's during acute hospitalizations, no data from outpatient setting   History of noncompliance with medical treatment    Morbid obesity [facility])     Patient Active Problem List   Diagnosis Date Noted   Paroxysmal A-fib (HCC) 08/02/2013   HTN (hypertension) 08/02/2013   Morbid obesity (HCC) 07/28/2013   Atrial fibrillation with rapid ventricular response- TEE CV 07/28/13 01/28/2011   History of noncompliance with medical treatment 01/28/2011   Borderline blood pressure 01/28/2011    Past Surgical History:  Procedure Laterality Date   CARDIOVERSION  01/29/2011   Procedure: CARDIOVERSION;  Surgeon: Lewayne Bunting, MD;  Location: [facility] ENDOSCOPY;  Service: Cardiovascular;  Laterality: N/A;   CARDIOVERSION N/A 07/28/2013   Procedure: CARDIOVERSION;  Surgeon: Thurmon Fair, MD;  Location: [facility] ENDOSCOPY;  Service: Cardiovascular;  Laterality: N/A;   TEE WITHOUT CARDIOVERSION  01/29/2011   Procedure: TRANSESOPHAGEAL ECHOCARDIOGRAM (TEE);  Surgeon: Lewayne Bunting, MD;   Location: [facility] ENDOSCOPY;  Service: Cardiovascular;  Laterality: N/A;   TEE WITHOUT CARDIOVERSION N/A 07/28/2013   Procedure: TRANSESOPHAGEAL ECHOCARDIOGRAM (TEE);  Surgeon: Thurmon Fair, MD;  Location: [facility] ENDOSCOPY;  Service: Cardiovascular;  Laterality: N/A;  Supposed to be TEE/Cardioversion, Trish will call anesthesia in the morning.        Home Medications    Prior to Admission medications   Medication Sig Start Date End Date Taking? Authorizing Provider  acetaminophen-codeine (TYLENOL #3) 300-30 MG per tablet Take 1 tablet by mouth every 4 (four) hours as needed for moderate pain. 08/22/13   Quentin Angst, MD  amLODipine (NORVASC) 5 MG tablet Take 1 tablet (5 mg total) by mouth daily. 06/02/19   Azalee Course, PA  HYDROcodone-acetaminophen (NORCO/VICODIN) 5-325 MG tablet Take 1 tablet by mouth every 6 (six) hours as needed. 12/27/18   Raeford Razor, MD  lisinopril (ZESTRIL) 20 MG tablet Take 1 tablet (20 mg total) by mouth daily. 02/02/19   O'Neal, Ronnald Ramp, MD  methocarbamol (ROBAXIN) 500 MG tablet Take 1 tablet (500 mg total) by mouth 2 (two) times daily. 02/14/19   Maxwell Caul, PA-C  metoprolol succinate (TOPROL-XL) 100 MG 24 hr tablet Take 1 tablet (100 mg total) by mouth daily. NEEDS APPOINTMENT FOR FUTURE REFILLS 02/02/19   O'Neal, Ronnald Ramp, MD  rivaroxaban (XARELTO) 20 MG TABS tablet Take 1 tablet (20 mg total) by mouth daily with supper. NEEDS APPOINTMENT FOR FUTURE REFILLS 08/16/14   Abelino Derrick, PA-C    Family History Family History  Problem Relation Age of Onset   Cancer - Other Mother        Uterine   CAD Neg Hx     Social History Social History   Tobacco Use   Smoking status: Never   Smokeless tobacco: Never  Vaping Use   Vaping Use: Never used  Substance Use Topics   Alcohol use: No    Comment: OCCASIONAL   Drug use: No     Allergies   Patient has no known allergies.   Review of Systems Review of Systems  Constitutional:  Positive  for chills and fever.  HENT:  Positive for congestion. Negative for ear discharge, ear pain, facial swelling, nosebleeds, postnasal drip, sinus pressure, sinus pain, sneezing and sore throat.   Respiratory:  Positive for cough.   Musculoskeletal:  Positive for myalgias.    Physical Exam Triage Vital Signs ED Triage Vitals  Enc Vitals Group     BP 03/13/21 1318 (!) 162/97     Pulse Rate 03/13/21 1318 87     Resp 03/13/21 1318 (!) 22     Temp 03/13/21 1318 99.8 F (37.7 C)     Temp Source 03/13/21 1318 Oral     SpO2 03/13/21 1318 95 %     Weight --      Height --      Head Circumference --      Peak Flow --      Pain Score 03/13/21 1315 10     Pain Loc --      Pain Edu? --      Excl. in GC? --    No data found.  Updated Vital Signs BP (!) 162/97 (BP Location: Right Arm) Comment (BP Location): large cuff   Pulse 87    Temp 99.8 F (37.7 C) (Oral)    Resp (!) 22    SpO2 95%   Visual Acuity Right Eye Distance:   Left Eye Distance:   Bilateral Distance:    Right Eye Near:   Left Eye Near:    Bilateral Near:     Physical Exam Vitals and nursing note reviewed.  Constitutional:      Appearance: He is obese. He is ill-appearing.  HENT:     Head: Normocephalic and atraumatic.     Right Ear: Tympanic membrane and ear canal normal. No drainage, swelling or tenderness. No middle ear effusion. Tympanic membrane is not erythematous.     Left Ear: Tympanic membrane and ear canal normal. No drainage, swelling or tenderness.  No middle ear effusion. Tympanic membrane is not erythematous.     Nose: Rhinorrhea present. No congestion.     Mouth/Throat:     Mouth: Mucous membranes are moist. No oral lesions.     Pharynx: Oropharynx is clear. No pharyngeal swelling, oropharyngeal exudate, posterior oropharyngeal erythema or uvula swelling.     Tonsils: No tonsillar exudate or tonsillar abscesses.  Eyes:     Extraocular Movements:     Left eye: Normal extraocular motion.      Conjunctiva/sclera: Conjunctivae normal.     Pupils: Pupils are equal, round, and reactive to light.  Neck:     Thyroid: No thyromegaly.  Cardiovascular:     Rate and Rhythm: Normal rate.     Heart sounds: Normal heart sounds. No murmur heard.   No friction rub. No gallop.  Pulmonary:     Effort: Pulmonary effort is normal. No respiratory distress.     Breath sounds: Normal breath sounds. No stridor.  No wheezing, rhonchi or rales.  Chest:     Chest wall: No tenderness.  Abdominal:     Palpations: Abdomen is soft.  Musculoskeletal:     Cervical back: Normal range of motion and neck supple.  Lymphadenopathy:     Cervical: No cervical adenopathy.  Skin:    General: Skin is warm.     Capillary Refill: Capillary refill takes less than 2 seconds.     Coloration: Skin is not pale.     Findings: No erythema or rash.  Neurological:     General: No focal deficit present.     Mental Status: He is alert.  Psychiatric:        Mood and Affect: Mood normal.        Behavior: Behavior normal.     UC Treatments / Results  Labs (all labs ordered are listed, but only abnormal results are displayed) Labs Reviewed  POC INFLUENZA A AND B ANTIGEN (URGENT CARE ONLY)    EKG   Radiology No results found.  Procedures Procedures (including critical care time)  Medications Ordered in UC Medications - No data to display  Initial Impression / Assessment and Plan / UC Course  I have reviewed the triage vital signs and the nursing notes.  Pertinent labs & imaging results that were available during my care of the patient were reviewed by me and considered in my medical decision making (see chart for details).     Viral syndrome - supportive care as outlined below. Flu test negative. Covid test pending. HTN - take medications, monitor BP. Elevated today. Cardiac exam benign. F/U with PCP.  Final Clinical Impressions(s) / UC Diagnoses   Final diagnoses:  Viral syndrome     Discharge  Instructions      Your flu test was negative. Awaiting your covid test results. Your symptoms are consistent with a viral illness. Increase your water intake and REST! May try OTC oscillococcinum for body aches. Alternating tylenol and ibuprofen may also help with fevers and body aches. RTC for any new or worsening symptoms.     ED Prescriptions   None    PDMP not reviewed this encounter.   Maretta Bees, [place] 03/13/21 1442    Maretta Bees, [place] 03/13/21 1523

## 2021-03-13 NOTE — Discharge Instructions (Addendum)
Your flu test was negative. Awaiting your covid test results. Your symptoms are consistent with a viral illness. Increase your water intake and REST! May try OTC oscillococcinum for body aches. Alternating tylenol and ibuprofen may also help with fevers and body aches. RTC for any new or worsening symptoms.

## 2021-03-13 NOTE — ED Triage Notes (Signed)
03/11/2021 is when symptoms started.  Patient has a sore throat, cough, chest congestion.  Patient has a stuffy nose, general aching and chills

## 2021-03-14 LAB — SARS CORONAVIRUS 2 (TAT 6-24 HRS): SARS Coronavirus 2: NEGATIVE

## 2021-05-01 ENCOUNTER — Other Ambulatory Visit: Payer: Self-pay | Admitting: Family Medicine

## 2021-05-01 DIAGNOSIS — M542 Cervicalgia: Secondary | ICD-10-CM

## 2021-05-07 ENCOUNTER — Other Ambulatory Visit: Payer: Self-pay

## 2021-05-07 ENCOUNTER — Ambulatory Visit
Admission: RE | Admit: 2021-05-07 | Discharge: 2021-05-07 | Disposition: A | Discharge status: Home / Self Care | Payer: 59 | Source: Ambulatory Visit | Attending: Family Medicine | Admitting: Family Medicine

## 2021-05-07 DIAGNOSIS — M542 Cervicalgia: Secondary | ICD-10-CM

## 2022-01-17 ENCOUNTER — Telehealth: Payer: Self-pay

## 2022-01-17 NOTE — Telephone Encounter (Signed)
   Pre-operative Risk Assessment    Patient Name: Edward Johnson  DOB: 12-28-1970 MRN: [id number]      Request for Surgical Clearance     Procedure:   COLONOSCOPY/ENDOSCOPY  Date of Surgery:  Clearance 02/11/22                                 Surgeon:  DR Therisa Doyne Surgeon's Group or Practice Name:  [facility] Phone number:  [phone] Fax number:  [phone]   Type of Clearance Requested:   - Pharmacy:  Hold Rivaroxaban (Xarelto) CAN THIS PATIENT TEMPORARILY STOP THE ABOVE MEDICATION BEFORE HIS PROCEDURE ,OR AFTER PROCEDURE IF POLYPS ARE REMOVED   Type of Anesthesia:   PROPOLOL   Additional requests/questions:  Please fax a copy of CARDIAC CLEARANCE to the surgeon's office.  Signed, Jeanmarie Plant Akiyah Eppolito  CCMA 01/17/2022, 5:08 PM

## 2022-01-20 NOTE — Telephone Encounter (Signed)
Left message for the patient to contact the office. 

## 2022-01-20 NOTE — Telephone Encounter (Signed)
    Primary Cardiologist:[place] Doreatha Lew, MD  Chart reviewed as part of pre-operative protocol coverage. Because of Edward Johnson's past medical history and time since last visit, he/she will require a follow-up visit in order to better assess preoperative cardiovascular risk.  Pre-op covering staff: - Please schedule appointment and call patient to inform them. - Please contact requesting surgeon's office via preferred method (i.e, phone, fax) to inform them of need for appointment prior to surgery.  If applicable, this message will also be routed to pharmacy pool and/or primary cardiologist for input on holding anticoagulant/antiplatelet agent as requested below so that this information is available at time of patient's appointment.   Deberah Pelton, NP  01/20/2022, 3:34 PM

## 2022-01-20 NOTE — Telephone Encounter (Signed)
Patient with diagnosis of PAF(paroxysmal atrial fibrillation) on Xrelto for anticoagulation.    Procedure: colonoscopy/endoscopy  Date of procedure: 02/03/2022   CHA2DS2-VASc Score = 1  This indicates a 0.6% annual risk of stroke. The patient's score is based upon: CHF History: 0 HTN History: 1 Diabetes History: 0 Stroke History: 0 Vascular Disease History: 0 Age Score: 0 Gender Score: 0      CrCl >100 mL/min (based on SrCr 0.92 on 07/30/2021 from [facility] ) Platelet count : no recent lab available    Pharmacy will comment after patient is seen for pre-op and once better hx is received. Last office visit 02/02/2019 and virtual visit 06/02/2019  **This guidance is not considered finalized until pre-operative APP has relayed final recommendations.**

## 2022-02-04 ENCOUNTER — Telehealth: Payer: Self-pay | Admitting: Cardiovascular Disease

## 2022-02-04 NOTE — Telephone Encounter (Signed)
I tried to call the pt back but no answer. Pt will need an appt in office for pre op clearance.

## 2022-02-04 NOTE — Telephone Encounter (Signed)
Pt calling back form 01/20/22 about clearance for 02/11/22

## 2022-02-05 ENCOUNTER — Other Ambulatory Visit: Payer: Self-pay | Admitting: Physician Assistant

## 2022-02-05 ENCOUNTER — Ambulatory Visit
Admission: RE | Admit: 2022-02-05 | Discharge: 2022-02-05 | Disposition: A | Discharge status: Home / Self Care | Payer: 59 | Source: Ambulatory Visit | Attending: Physician Assistant | Admitting: Physician Assistant

## 2022-02-05 DIAGNOSIS — M25532 Pain in left wrist: Secondary | ICD-10-CM

## 2022-02-05 NOTE — Telephone Encounter (Signed)
Tried calling the requesting office to inform that patient will need an office appointment for clearance. Office is closed, faxed over encounter note with information.

## 2022-02-05 NOTE — Telephone Encounter (Signed)
Left voice message for patient to give office a call back to be scheduled for pre-op clearance.

## 2022-02-05 NOTE — Telephone Encounter (Deleted)
Left voice message for patient to cal our office to get scheduled for a pre=op appointment.

## 2022-02-08 NOTE — Progress Notes (Deleted)
Cardiology Office Note:    Date:  02/08/2022   ID:  [name], DOB 12-13-1970, MRN [id number]  PCP:  Tracey Harries, MD  Cardiologist:  Reatha Harps, MD  Electrophysiologist:  None   Referring MD: Tracey Harries, MD   Chief Complaint: pre-op evaluation  History of Present Illness:    Edward Johnson is a 51 y.o. male with a history of paroxysmal atrial fibrillation s/p multiple cardioversions on Xarelto, hypertension, and morbid obesity who is followed by Dr. Flora Lipps and presents today for pre-op evaluation for endoscopy/ colonoscopy.  Patient has a long history of paroxysmal atrial fibrillation and has had multiple cardioversions in the past (2005, 2007, 2012, 2015). He was referred to Dr. Flora Lipps in 01/2019 after having not been seen by Cardiology in several years. Monitor in 02/2019 for further evaluation of palpitations showed rare PACs/PVCs but no atrial fibrillation. Patient was last seen by Azalee Course, PA-C, for a virtual visit in 05/2019 at which time he still reported occasional palpitations but was otherwise doing well from a cardiac standpoint. He felt like his Amlodipine 10mg  was making him jittery so this was decreased to 5mg  daily. Echo in 06/2019 showed LVEF of 60-65% with moderate LVH and normal diastolic parameters, normal RV, and no significant valvular disease.   Patient presents today for pre-op evaluation for planned upcoming endoscopy/ colonoscopy. ***  Pre-Op Evaluation Patient has an upcoming endoscopy/ colonoscopy planned. He is doing well from a cardiac standpoint with no chest pain, shortness of breath, acute CHF symptoms, significant palpitations, lightheadedness/dizziness, syncope. He is able to complete >4.0 METS without any anginal symptoms. Per Revised Cardiac Risk Index Criteria), considered very low risk with a 0.4% perioperative risk for adverse cardiac events. Therefore, based on ACC/AHA guidelines, patient would be at acceptable risk for the planned  procedure without further cardiovascular testing. Patient will need to hold Xarelto. He has not had labs in some time so will check CBC and BMET today and then Pharmacy will give guidance on how long Xarelto can be held. After this, I will route this recommendation to the requesting party via Epic fax function. ***  Paroxysmal Atrial Fibrillation S/p multiple cardioversions in the past. Monitor in 02/2019 showed no evidence of atrial fibrillation. - Maintaining sinus rhythm. - Continue Toprol-XL 100mg  daily. - Continue Xarelto 20mg  daily.  Hypertension BP well controlled. *** - Continue current medications: Amlodipine 5mg  daily, Lisinopril 67m daily, and Toprol-XL 100mg  daily.  Morbid Obesity ***  Past Medical History:  Diagnosis Date   Atrial fibrillation (HCC)    history of paroxysmal afib, s/p TEE cardioversion (2005, 2007, 2012), and chemical cardioversion with flecainide (2005, 2011)   Borderline blood pressure    borderline BP's during acute hospitalizations, no data from outpatient setting   History of noncompliance with medical treatment    Morbid obesity [facility])     Past Surgical History:  Procedure Laterality Date   CARDIOVERSION  01/29/2011   Procedure: CARDIOVERSION;  Surgeon: 08-22-1976, MD;  Location: [facility] ENDOSCOPY;  Service: Cardiovascular;  Laterality: N/A;   CARDIOVERSION N/A 07/28/2013   Procedure: CARDIOVERSION;  Surgeon: 2012, MD;  Location: [facility] ENDOSCOPY;  Service: Cardiovascular;  Laterality: N/A;   TEE WITHOUT CARDIOVERSION  01/29/2011   Procedure: TRANSESOPHAGEAL ECHOCARDIOGRAM (TEE);  Surgeon: 01/31/2011, MD;  Location: [facility] ENDOSCOPY;  Service: Cardiovascular;  Laterality: N/A;   TEE WITHOUT CARDIOVERSION N/A 07/28/2013   Procedure: TRANSESOPHAGEAL ECHOCARDIOGRAM (TEE);  Surgeon: 07/30/2013, MD;  Location: [facility] ENDOSCOPY;  Service: Cardiovascular;  Laterality: N/A;  Supposed to be TEE/Cardioversion, Trish will call anesthesia in the morning.      Current Medications: No outpatient medications have been marked as taking for the 02/11/22 encounter (Appointment) with Corrin Parker, PA-C.     Allergies:   Patient has no known allergies.   Social History   Socioeconomic History   Marital status: Married    Spouse name: Not on file   Number of children: Not on file   Years of education: Not on file   Highest education level: Not on file  Occupational History   Not on file  Tobacco Use   Smoking status: Never   Smokeless tobacco: Never  Vaping Use   Vaping Use: Never used  Substance and Sexual Activity   Alcohol use: No    Comment: OCCASIONAL   Drug use: No   Sexual activity: Not on file  Other Topics Concern   Not on file  Social History Narrative   Not on file   Social Determinants of Health   Financial Resource Strain: Not on file  Food Insecurity: Not on file  Transportation Needs: Not on file  Physical Activity: Not on file  Stress: Not on file  Social Connections: Not on file     Family History: The patient's family history includes Cancer - Other in his mother. There is no history of CAD.  ROS:   Please see the history of present illness.     EKGs/Labs/Other Studies Reviewed:    The following studies were reviewed:  [employer] Monitor 02/2019: Enrollment 02/23/2019-02/25/2019 (1 day 17 hours). Patient had a min HR of 49 bpm (sinus bradycardia), max HR of 115 bpm (sinus tachycardia), and avg HR of 65 bpm (normal sinus rhythm). Predominant underlying rhythm was Sinus Rhythm. No Isolated SVEs, SVE Couplets, or SVE Triplets were present. Isolated VEs were rare (<1.0%), and no VE Couplets or VE Triplets were present. Diary symptoms reported outside the wear period. There were issues with patch attachment to the patient. No atrial fibrillation.    Impression: 1. No atrial fibrillation detected.  2. Rare ectopy. _______________  Echocardiogram 06/20/2019: Impressions: 1. Left ventricular ejection  fraction, by estimation, is 60 to 65%. The  left ventricle has normal function. The left ventricle has no regional  wall motion abnormalities. There is moderate concentric left ventricular  hypertrophy. Left ventricular  diastolic parameters were normal.   2. Right ventricular systolic function is normal. The right ventricular  size is normal.   3. The mitral valve is normal in structure. No evidence of mitral valve  regurgitation. No evidence of mitral stenosis.   4. The aortic valve is normal in structure. Aortic valve regurgitation is  not visualized. No aortic stenosis is present.   EKG:  EKG ordered today. EKG personally reviewed and demonstrates ***.  Recent Labs: No results found for requested labs within last 365 days.  Recent Lipid Panel    Component Value Date/Time   CHOL 193 01/29/2011 0602   TRIG 103 01/29/2011 0602   HDL 42 01/29/2011 0602   CHOLHDL 4.6 01/29/2011 0602   VLDL 21 01/29/2011 0602   LDLCALC 130 (H) 01/29/2011 0602    Physical Exam:    Vital Signs: There were no vitals taken for this visit.    Wt Readings from Last 3 Encounters:  06/02/19 (!) 365 lb (165.6 kg)  02/02/19 (!) 370 lb 12.8 oz (168.2 kg)  12/27/18 (!) 350 lb (158.8 kg)  General: 51 y.o. male in no acute distress. HEENT: Normocephalic and atraumatic. Sclera clear. EOMs intact. Neck: Supple. No carotid bruits. No JVD. Heart: *** RRR. Distinct S1 and S2. No murmurs, gallops, or rubs. Radial and distal pedal pulses 2+ and equal bilaterally. Lungs: No increased work of breathing. Clear to ausculation bilaterally. No wheezes, rhonchi, or rales.  Abdomen: Soft, non-distended, and non-tender to palpation. Bowel sounds present in all 4 quadrants.  MSK: Normal strength and tone for age. *** Extremities: No lower extremity edema.    Skin: Warm and dry. Neuro: Alert and oriented x3. No focal deficits. Psych: Normal affect. Responds appropriately.   Assessment:    No diagnosis  found.  Plan:     Disposition: Follow up in ***   Medication Adjustments/Labs and Tests Ordered: Current medicines are reviewed at length with the patient today.  Concerns regarding medicines are outlined above.  No orders of the defined types were placed in this encounter.  No orders of the defined types were placed in this encounter.   There are no Patient Instructions on file for this visit.   Signed, Corrin Parker, PA-C  02/08/2022 5:30 PM    [facility]

## 2022-02-10 ENCOUNTER — Encounter: Payer: Self-pay | Admitting: Student

## 2022-02-10 NOTE — Telephone Encounter (Signed)
See clearance notes for complete notes

## 2022-02-10 NOTE — Telephone Encounter (Signed)
Please see all notes. Pt has been scheduled for an appt in office 02/11/22. Once the pt has been cleared we will fax clearance notes.      All Conversations: Clearance 02/11/22 (Newest Message First) February 05, 2022       02/05/22  2:33 PM Dorris Fetch, CMA attempted to contact [name] (Left Message)  Dorris Fetch, [facility]      02/05/22  2:29 PM Note Tried calling the requesting office to inform that patient will need an office appointment for clearance. Office is closed, faxed over encounter note with information.       Dorris Fetch, [facility]      02/05/22  2:24 PM Note Left voice message for patient to give office a call back to be scheduled for pre-op clearance.             02/05/22  2:23 PM Dorris Fetch, CMA attempted to contact [name] (Left Message)   February 04, 2022  Me      02/04/22  2:10 PM Note I tried to call the pt back but no answer. Pt will need an appt in office for pre op clearance.             02/04/22  2:10 PM You attempted to contact Arntz, Siddh (No Answer/Busy)       CM   02/04/22  1:29 PM Terese Door R routed this conversation to [employer], Chloe R   CM   02/04/22  1:29 PM Note Pt calling back form 01/20/22 about clearance for 02/11/22               02/04/22  1:25 PM [name] contacted [facility]

## 2022-02-11 ENCOUNTER — Ambulatory Visit: Payer: 59 | Admitting: Student

## 2022-02-12 NOTE — Progress Notes (Deleted)
Cardiology Office Note:    Date:  02/12/2022   ID:  [name], DOB [birth year], MRN [id number]  PCP:  Tracey Harries, MD  Cardiologist:  Reatha Harps, MD  Electrophysiologist:  None   Referring MD: Tracey Harries, MD   Chief Complaint: pre-op evaluation  History of Present Illness:    Edward Johnson is a 51 y.o. male with a history of paroxysmal atrial fibrillation s/p multiple cardioversions on Xarelto, hypertension, and morbid obesity who is followed by Dr. Flora Lipps and presents today for pre-op evaluation for endoscopy/ colonoscopy.  Patient has a long history of paroxysmal atrial fibrillation and has had multiple cardioversions in the past (2005, 2007, 2012, 2015). He was referred to Dr. Flora Lipps in 01/2019 after having not been seen by Cardiology in several years. Monitor in 02/2019 for further evaluation of palpitations showed rare PACs/PVCs but no atrial fibrillation. Patient was last seen by Azalee Course, PA-C, for a virtual visit in 05/2019 at which time he still reported occasional palpitations but was otherwise doing well from a cardiac standpoint. He felt like his Amlodipine 10mg  was making him jittery so this was decreased to 5mg  daily. Echo in 06/2019 showed LVEF of 60-65% with moderate LVH and normal diastolic parameters, normal RV, and no significant valvular disease.   Patient presents today for pre-op evaluation for planned upcoming endoscopy/ colonoscopy. ***  Pre-Op Evaluation Patient has an upcoming endoscopy/ colonoscopy planned. He is doing well from a cardiac standpoint with no chest pain, shortness of breath, acute CHF symptoms, significant palpitations, lightheadedness/dizziness, syncope. He is able to complete >4.0 METS without any anginal symptoms. Per Revised Cardiac Risk Index Criteria), considered very low risk with a 0.4% perioperative risk for adverse cardiac events. Therefore, based on ACC/AHA guidelines, patient would be at acceptable risk for the planned  procedure without further cardiovascular testing. Patient will need to hold Xarelto. He has not had labs in some time so will check CBC and BMET today and then Pharmacy will give guidance on how long Xarelto can be held. After this, I will route this recommendation to the requesting party via Epic fax function. ***  Paroxysmal Atrial Fibrillation S/p multiple cardioversions in the past. Monitor in 02/2019 showed no evidence of atrial fibrillation. - Maintaining sinus rhythm. - Continue Toprol-XL 100mg  daily. - Continue Xarelto 20mg  daily.  Hypertension BP well controlled. *** - Continue current medications: Amlodipine 5mg  daily, Lisinopril 34m daily, and Toprol-XL 100mg  daily.  Morbid Obesity ***   Past Surgical History:  Procedure Laterality Date   CARDIOVERSION  01/29/2011   Procedure: CARDIOVERSION;  Surgeon: , MD;  Location: [facility] ENDOSCOPY;  Service: Cardiovascular;  Laterality: N/A;   CARDIOVERSION N/A 07/28/2013   Procedure: CARDIOVERSION;  Surgeon: 30m, MD;  Location: [facility] ENDOSCOPY;  Service: Cardiovascular;  Laterality: N/A;   TEE WITHOUT CARDIOVERSION  01/29/2011   Procedure: TRANSESOPHAGEAL ECHOCARDIOGRAM (TEE);  Surgeon: 01/31/2011, MD;  Location: [facility] ENDOSCOPY;  Service: Cardiovascular;  Laterality: N/A;   TEE WITHOUT CARDIOVERSION N/A 07/28/2013   Procedure: TRANSESOPHAGEAL ECHOCARDIOGRAM (TEE);  Surgeon: 07/30/2013, MD;  Location: [facility] ENDOSCOPY;  Service: Cardiovascular;  Laterality: N/A;  Supposed to be TEE/Cardioversion, Trish will call anesthesia in the morning.     Current Medications: No outpatient medications have been marked as taking for the 02/17/22 encounter (Appointment) with Lewayne Bunting, PA-C.     Allergies:   Patient has no known allergies.   Social History   Socioeconomic History   Marital status: Married  Spouse name: Not on file   Number of children: Not on file   Years of education: Not on file   Highest  education level: Not on file  Occupational History   Not on file  Tobacco Use   Smoking status: Never   Smokeless tobacco: Never  Vaping Use   Vaping Use: Never used  Substance and Sexual Activity   Alcohol use: No    Comment: OCCASIONAL   Drug use: No   Sexual activity: Not on file  Other Topics Concern   Not on file  Social History Narrative   Not on file   Social Determinants of Health   Financial Resource Strain: Not on file  Food Insecurity: Not on file  Transportation Needs: Not on file  Physical Activity: Not on file  Stress: Not on file  Social Connections: Not on file     Family History: The patient's family history includes Cancer - Other in his mother. There is no history of CAD.  ROS:   Please see the history of present illness.     EKGs/Labs/Other Studies Reviewed:    The following studies were reviewed :  Monitor 03-20-19: Enrollment 02/23/2019-02/25/2019 (1 day 17 hours). Patient had a min HR of 49 bpm (sinus bradycardia), max HR of 115 bpm (sinus tachycardia), and avg HR of 65 bpm (normal sinus rhythm). Predominant underlying rhythm was Sinus Rhythm. No Isolated SVEs, SVE Couplets, or SVE Triplets were present. Isolated VEs were rare (<1.0%), and no VE Couplets or VE Triplets were present. Diary symptoms reported outside the wear period. There were issues with patch attachment to the patient. No atrial fibrillation.    Impression: 1. No atrial fibrillation detected.  2. Rare ectopy.  _______________  Echocardiogram 06/20/2019: Impressions: 1. Left ventricular ejection fraction, by estimation, is 60 to 65%. The  left ventricle has normal function. The left ventricle has no regional  wall motion abnormalities. There is moderate concentric left ventricular  hypertrophy. Left ventricular  diastolic parameters were normal.   2. Right ventricular systolic function is normal. The right ventricular  size is normal.   3. The mitral valve is normal in  structure. No evidence of mitral valve  regurgitation. No evidence of mitral stenosis.   4. The aortic valve is normal in structure. Aortic valve regurgitation is  not visualized. No aortic stenosis is present.  _______________  Carotid Ultrasound 05/07/2021: Impressions: 1. Right carotid artery system: Patent without significant atherosclerotic plaque formation. 2. Left carotid artery system: Patent without significant atherosclerotic plaque formation. 3.  Vertebral artery system: Patent with antegrade flow bilaterally.  EKG:  EKG ordered today. EKG personally reviewed and demonstrates ***.  Recent Labs: No results found for requested labs within last 365 days.  Recent Lipid Panel    Component Value Date/Time   CHOL 193 01/29/2011 0602   TRIG 103 01/29/2011 0602   HDL 42 01/29/2011 0602   CHOLHDL 4.6 01/29/2011 0602   VLDL 21 01/29/2011 0602   LDLCALC 130 (H) 01/29/2011 0602    Physical Exam:    Vital Signs: There were no vitals taken for this visit.    Wt Readings from Last 3 Encounters:  06/02/19 (!) 365 lb (165.6 kg)  02/02/19 (!) 370 lb 12.8 oz (168.2 kg)  12/27/18 (!) 350 lb (158.8 kg)     General: 51 y.o. male in no acute distress. HEENT: Normocephalic and atraumatic. Sclera clear. EOMs intact. Neck: Supple. No carotid bruits. No JVD. Heart: *** RRR. Distinct S1 and S2.  No murmurs, gallops, or rubs. Radial and distal pedal pulses 2+ and equal bilaterally. Lungs: No increased work of breathing. Clear to ausculation bilaterally. No wheezes, rhonchi, or rales.  Abdomen: Soft, non-distended, and non-tender to palpation. Bowel sounds present in all 4 quadrants.  MSK: Normal strength and tone for age. *** Extremities: No lower extremity edema.    Skin: Warm and dry. Neuro: Alert and oriented x3. No focal deficits. Psych: Normal affect. Responds appropriately.   Assessment:    No diagnosis found.  Plan:     Disposition: Follow up in ***   Medication  Adjustments/Labs and Tests Ordered: Current medicines are reviewed at length with the patient today.  Concerns regarding medicines are outlined above.  No orders of the defined types were placed in this encounter.  No orders of the defined types were placed in this encounter.   There are no Patient Instructions on file for this visit.   Signed, Corrin Parker, PA-C  02/12/2022 11:52 AM    [facility]

## 2022-02-15 NOTE — Progress Notes (Unsigned)
Cardiology Clinic Note   Patient Name: Edward Johnson Date of Encounter: 02/17/2022  Primary Care Provider:  Tracey Harries, MD Primary Cardiologist:  Reatha Harps, MD  Patient Profile    Edward Johnson is a 51 y.o. male with a past medical history of PAF s/p multiple cardioversion on BB and anticoagulation, hypertension, OSA, and obesity who presents to the clinic today for preoperative evaluation for colonoscopy/endoscopy.   Past Medical History    Past Medical History:  Diagnosis Date   Atrial fibrillation [facility])    history of paroxysmal afib, s/p TEE cardioversion (2005, 2007, 2012), and chemical cardioversion with flecainide (2005, 2011)   History of noncompliance with medical treatment    Hypertension    Morbid obesity [facility])    Past Surgical History:  Procedure Laterality Date   CARDIOVERSION  01/29/2011   Procedure: CARDIOVERSION;  Surgeon: Lewayne Bunting, MD;  Location: [facility] ENDOSCOPY;  Service: Cardiovascular;  Laterality: N/A;   CARDIOVERSION N/A 07/28/2013   Procedure: CARDIOVERSION;  Surgeon: Thurmon Fair, MD;  Location: [facility] ENDOSCOPY;  Service: Cardiovascular;  Laterality: N/A;   TEE WITHOUT CARDIOVERSION  01/29/2011   Procedure: TRANSESOPHAGEAL ECHOCARDIOGRAM (TEE);  Surgeon: Lewayne Bunting, MD;  Location: [facility] ENDOSCOPY;  Service: Cardiovascular;  Laterality: N/A;   TEE WITHOUT CARDIOVERSION N/A 07/28/2013   Procedure: TRANSESOPHAGEAL ECHOCARDIOGRAM (TEE);  Surgeon: Thurmon Fair, MD;  Location: [facility] ENDOSCOPY;  Service: Cardiovascular;  Laterality: N/A;  Supposed to be TEE/Cardioversion, Trish will call anesthesia in the morning.     Allergies  No Known Allergies  History of Present Illness    Edward Johnson has a past medical history of: PAF. History of multiple cardioversion 2005, 2007, 2012, 2015. Event monitor 04/05/2019: Predominant underlying rhythm was sinus rhythm. No atrial fibrillation detected. Rare ectopy.  Echo 06/20/2019: EF 60-65%. Moderate  concentric LVH.  Hypertension.  Obesity.   Patient has a long history of PAF with multiple cardioversions in 2005, 2007, 2012, and 2015. Patient was not evaluated in the office until he saw Dr. Flora Lipps on 02/02/2019 who counseled patient on lifestyle modification and continued him on Xarelto and Metoprolol. He had a virtual visit with Azalee Course, PA-C on 06/02/2019. At that time he was doing well with no complaints of palpitations. He was continued on Metoprolol and Xarelto and his amlodipine dose was reduced to 5 mg secondary to feeling jittery on the 10 mg dose.   Today, patient is doing well from a cardiac standpoint. Patient denies shortness of breath or dyspnea on exertion. No chest pain, pressure, or tightness. Denies lower extremity edema, orthopnea, or PND. No palpitations for the last year or more. He has chronic back pain that has required him to go on disability. He is able to walk up to 2 blocks and climb 2 fights of stairs without chest pain or shortness of breath. He has an upcoming colonoscopy coming up for which he would need to stop his Xarelto. Pharmacy reviewed his chart prior to this office visit and requires updated lab work prior to giving recommendations. We will obtain labs today. Patient's BP is high today at 146/78 on intake and 140/70 at recheck. He only has a wrist cuff at home so he does not check it. He takes amlodipine and lisinopril in the afternoon and metoprolol at night. He does not eat fried foods but does not regulate his sodium intake. Patient is interested in assistance with weight loss.    Home Medications    Current Meds  Medication Sig  amLODipine (NORVASC) 10 MG tablet Take 1 tablet (10 mg total) by mouth daily.    Family History    Family History  Problem Relation Age of Onset   Cancer - Other Mother        Uterine   CAD Neg Hx    He indicated that his mother is deceased. He indicated that his father is alive. He indicated that the status of his neg hx  is unknown.   Social History    Social History   Socioeconomic History   Marital status: Married    Spouse name: Not on file   Number of children: Not on file   Years of education: Not on file   Highest education level: Not on file  Occupational History   Not on file  Tobacco Use   Smoking status: Never   Smokeless tobacco: Never  Vaping Use   Vaping Use: Never used  Substance and Sexual Activity   Alcohol use: No    Comment: OCCASIONAL   Drug use: No   Sexual activity: Not on file  Other Topics Concern   Not on file  Social History Narrative   Not on file   Social Determinants of Health   Financial Resource Strain: Not on file  Food Insecurity: Not on file  Transportation Needs: Not on file  Physical Activity: Not on file  Stress: Not on file  Social Connections: Not on file  Intimate Partner Violence: Not on file     Review of Systems    General: No chills, fever, night sweats or weight changes.  Cardiovascular:  No chest pain, dyspnea on exertion, edema, orthopnea, palpitations, paroxysmal nocturnal dyspnea. Dermatological: No rash, lesions/masses Respiratory: No cough, dyspnea Urologic: No hematuria, dysuria Abdominal:   No nausea, vomiting, diarrhea, bright red blood per rectum, melena, or hematemesis Neurologic:  No visual changes, weakness, changes in mental status. All other systems reviewed and are otherwise negative except as noted above.  Physical Exam    VS:  BP (!) 146/78   Pulse 76   Ht 6\' 6"  (1.981 m)   Wt (!) 334 lb 6.4 oz (151.7 kg)   SpO2 98%   BMI 38.64 kg/m  , BMI Body mass index is 38.64 kg/m. GEN:  Well nourished, well developed, in no acute distress. HEENT: Normal. Neck: Supple, no JVD, carotid bruits, or masses. Cardiac: RRR, no murmurs, rubs, or gallops. No clubbing, cyanosis, edema.  Radials/DP/PT 2+ and equal bilaterally.  Respiratory:  Respirations regular and unlabored, clear to auscultation bilaterally. GI: Soft,  nontender, nondistended. MS: No deformity or atrophy. Ambulates with a single point cane.  Skin: Warm and dry, no rash. Neuro: Strength and sensation are intact. Psych: Normal affect.  Accessory Clinical Findings    Recent Labs: No results found for requested labs within last 365 days.   Recent Lipid Panel    Component Value Date/Time   CHOL 193 01/29/2011 0602   TRIG 103 01/29/2011 0602   HDL 42 01/29/2011 0602   CHOLHDL 4.6 01/29/2011 0602   VLDL 21 01/29/2011 0602   LDLCALC 130 (H) 01/29/2011 0602    HYPERTENSION CONTROL Vitals:   02/17/22 1358 02/17/22 1650  BP: (!) 146/78 (!) 140/70    The patient's blood pressure is elevated above target today.  In order to address the patient's elevated BP: A current anti-hypertensive medication was adjusted today.       ECG personally reviewed by me today: NSR, HR 76  No significant changes from 02/09/2019  Assessment & Plan    Preoperative cardiovascular risk assessment. Patient has a colonoscopy/endoscopy planned. Patient does not have a history of ischemic heart disease, PCI, heart failure, or stroke. According to the RCRI, patient has a 0.4% risk of MACE. Patient reports activity equivalent to 4.0 METS (walking up to 2 blocks and climbing 2 fights of stairs). Given past medical history and time since last visit, based on ACC/AHA guidelines, Jedediah Al would be at acceptable risk for the planned procedure without further cardiovascular testing. Per pharm D, recommendations for holding Xarelto will be made after labs are resulted. Will get a CBC and BMP today.  Per Pharm D:  CHA2DS2-VASc Score = 1   This indicates a 0.6% annual risk of stroke. The patient's score is based upon: CHF History: 0 HTN History: 1 Diabetes History: 0 Stroke History: 0 Vascular Disease History: 0 Age Score: 0 Gender Score: 0        CrCl >118ml/min Platelet count 319   Per office protocol, patient can hold Xarelto for 2 days prior to  procedure.    PAF. S/p multiple cardioversions in 2005, 2007, 2012 and 2015. Event monitor January 2021 underlying rhythm was sinus rhythm with no afib. Patient denies palpitations for the last year. Continue Xarelto and Metoprolol.  Hypertension. Today, patient's BP 146/78 on intake and 140/70 at recheck. He takes amlodipine and lisinopril in the morning and Metoprolol in the evening. He does not have a way to check his BP at home. Will increase amlodipine to 10 mg daily. Discussed history of feeling jittery on this dose in the past. He feels this may have been related to being in afib during that time. He is willing to increase it and see how he does. Provided Salty 6 to help guide patient with sodium restriction.  Obesity. Patient is limited with his ability for activity secondary to chronic back pain. He is very interested in losing weight. Will refer to [facility].      Disposition: CBC and BMP today. Increase amlodipine to 10 mg daily. Refer to [facility]. Return in 6 months or sooner as needed.    Justice Britain. Edmundo Tedesco, NP-C     02/17/2022, 3:47 PM [place] [address] Office [phone] Fax [phone]   I spent 12 minutes examining this patient, reviewing medications, and using patient centered shared decision making involving her cardiac care.  Prior to her visit I spent greater than 20 minutes reviewing her past medical history,  medications, and prior cardiac tests.

## 2022-02-17 ENCOUNTER — Ambulatory Visit: Payer: Medicaid Other | Attending: Student | Admitting: Student

## 2022-02-17 ENCOUNTER — Encounter: Payer: Self-pay | Admitting: Student

## 2022-02-17 VITALS — BP 140/70 | HR 76 | Ht 78.0 in | Wt 334.4 lb

## 2022-02-17 DIAGNOSIS — I480 Paroxysmal atrial fibrillation: Secondary | ICD-10-CM

## 2022-02-17 DIAGNOSIS — I10 Essential (primary) hypertension: Secondary | ICD-10-CM

## 2022-02-17 DIAGNOSIS — Z01810 Encounter for preprocedural cardiovascular examination: Secondary | ICD-10-CM

## 2022-02-17 MED ORDER — AMLODIPINE BESYLATE 10 MG PO TABS: 10.0000 mg | ORAL_TABLET | Freq: Every day | ORAL | 2 refills | Status: DC

## 2022-02-17 NOTE — Patient Instructions (Signed)
Medication Instructions:  Increase Amlodipine from 5 mg to 10 mg ( Take 1 Tablet Daily). *If you need a refill on your cardiac medications before your next appointment, please call your pharmacy*   Lab Work: BMET,Cbc Today If you have labs (blood work) drawn today and your tests are completely normal, you will receive your results only by: MyChart Message (if you have MyChart) OR A paper copy in the mail If you have any lab test that is abnormal or we need to change your treatment, we will call you to review the results.   Testing/Procedures: No Testing    Follow-Up: At [facility], you and your health needs are our priority.  As part of our continuing mission to provide you with exceptional heart care, we have created designated Provider Care Teams.  These Care Teams include your primary Cardiologist (physician) and Advanced Practice Providers (APPs -  Physician Assistants and Nurse Practitioners) who all work together to provide you with the care you need, when you need it.  We recommend signing up for the patient portal called "MyChart".  Sign up information is provided on this After Visit Summary.  MyChart is used to connect with patients for Virtual Visits (Telemedicine).  Patients are able to view lab/test results, encounter notes, upcoming appointments, etc.  Non-urgent messages can be sent to your provider as well.   To learn more about what you can do with MyChart, go to [email or web address].    Your next appointment:   6 month(s)  The format for your next appointment:   In Person  Provider:   Reatha Harps, MD

## 2022-02-18 LAB — BASIC METABOLIC PANEL
BUN/Creatinine Ratio: 14 (ref 9–20)
BUN: 15 mg/dL (ref 6–24)
CO2: 24 mmol/L (ref 20–29)
Calcium: 9.7 mg/dL (ref 8.7–10.2)
Chloride: 103 mmol/L (ref 96–106)
Creatinine, Ser: 1.04 mg/dL (ref 0.76–1.27)
Glucose: 82 mg/dL (ref 70–99)
Potassium: 4.3 mmol/L (ref 3.5–5.2)
Sodium: 139 mmol/L (ref 134–144)
eGFR: 87 mL/min/{1.73_m2} (ref >59)

## 2022-02-18 LAB — CBC
Hematocrit: 37.2 % — ABNORMAL LOW (ref 37.5–51.0)
Hemoglobin: 12.5 g/dL — ABNORMAL LOW (ref 13.0–17.7)
MCH: 32.1 pg (ref 26.6–33.0)
MCHC: 33.6 g/dL (ref 31.5–35.7)
MCV: 95 fL (ref 79–97)
Platelets: 319 10*3/uL (ref 150–450)
RBC: 3.9 x10E6/uL — ABNORMAL LOW (ref 4.14–5.80)
RDW: 11.5 % — ABNORMAL LOW (ref 11.6–15.4)
WBC: 6.9 10*3/uL (ref 3.4–10.8)

## 2022-02-18 NOTE — Telephone Encounter (Signed)
Patient with diagnosis of PAF(paroxysmal atrial fibrillation) on Xrelto for anticoagulation.     Procedure: colonoscopy/endoscopy  Date of procedure: 02/03/2022    CHA2DS2-VASc Score = 1   This indicates a 0.6% annual risk of stroke. The patient's score is based upon: CHF History: 0 HTN History: 1 Diabetes History: 0 Stroke History: 0 Vascular Disease History: 0 Age Score: 0 Gender Score: 0       CrCl >160ml/min Platelet count 319  Per office protocol, patient can hold Xarelto for 2 days prior to procedure.    **This guidance is not considered finalized until pre-operative APP has relayed final recommendations.**

## 2022-02-18 NOTE — Telephone Encounter (Signed)
Patient seen by D. Wittenborn in office yesterday who requested this info. Will forward to her to addend her OV to route to surgeon. Will remove from preop APP box, no additional separate input needed from our team at this time.

## 2022-02-21 ENCOUNTER — Telehealth: Payer: Self-pay

## 2022-02-21 NOTE — Telephone Encounter (Addendum)
Called patient regarding results. Unable to leave voicemail due to number out of service. Letter mailed 02/21/2022----- Message from Edward Levering, NP sent at 02/19/2022  7:40 AM EST ----- Please let patient know overall his labs look good. Electrolytes and kidney function are normal. His hemoglobin is a little low at 12.5 (normal is 13). Please ask him to follow-up with PCP. We will forward results to PCP.  Thank you!

## 2022-04-01 ENCOUNTER — Encounter (INDEPENDENT_AMBULATORY_CARE_PROVIDER_SITE_OTHER): Payer: Self-pay

## 2022-04-11 IMAGING — US US CAROTID DUPLEX BILAT
1 series · 13 of 24 positions shown · non-contrast
Comparison: None.

CLINICAL DATA: 50-year-old male with right-sided neck pain.

EXAM:
BILATERAL CAROTID DUPLEX ULTRASOUND
TECHNIQUE: Gray scale imaging, color Doppler and duplex ultrasound were
performed of bilateral carotid and vertebral arteries in the neck.

[Series 1: us carotid duplex bilat · 0.07mm/px · 13 of 52 slices shown]
[im 1/52]
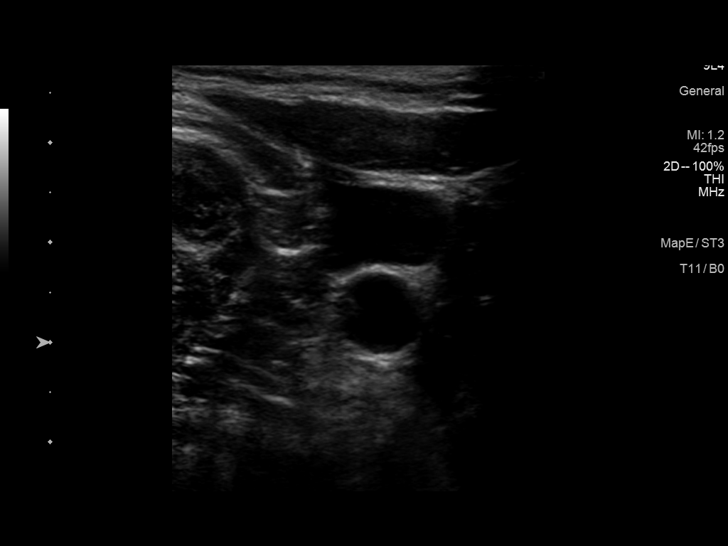
[im 5/52]
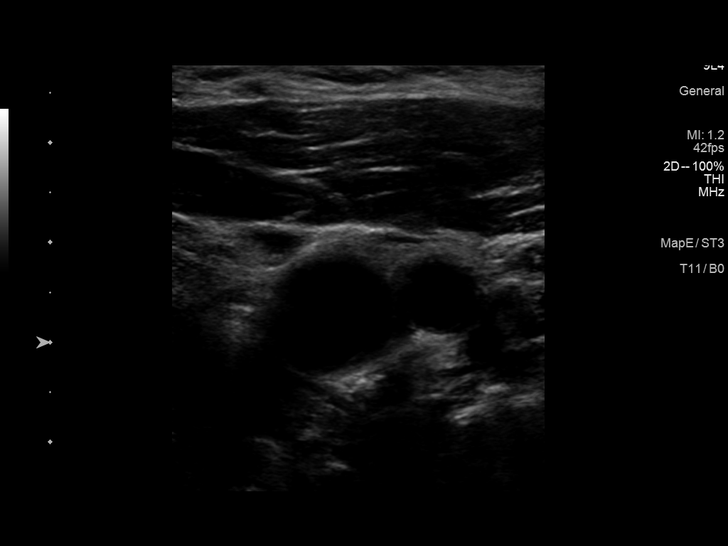
[im 9/52]
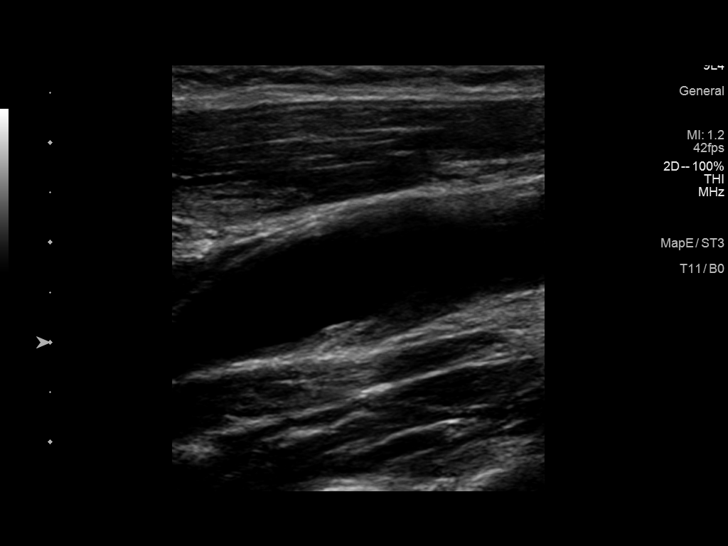
[im 14/52]
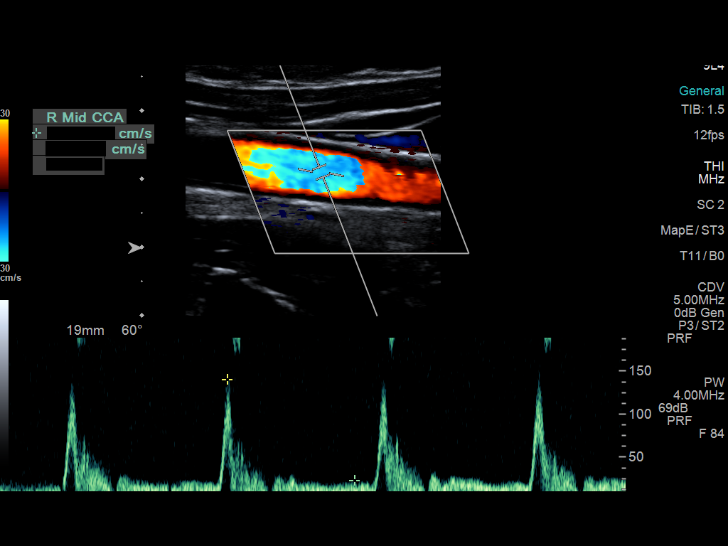
[im 18/52]
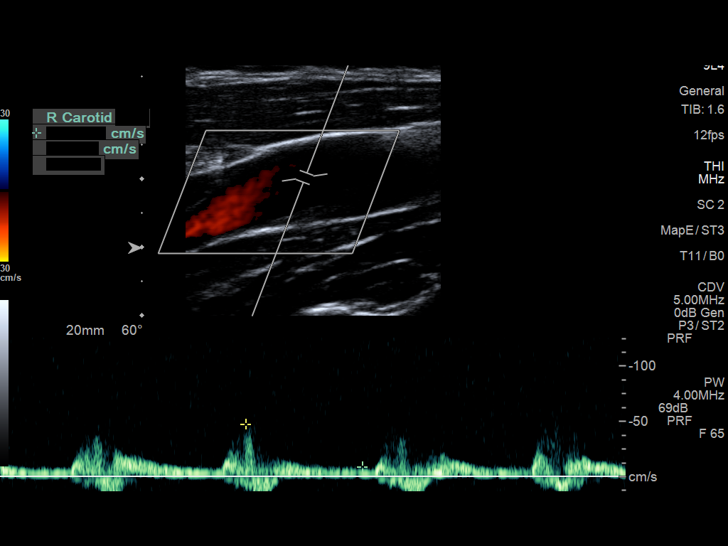
[im 23/52]
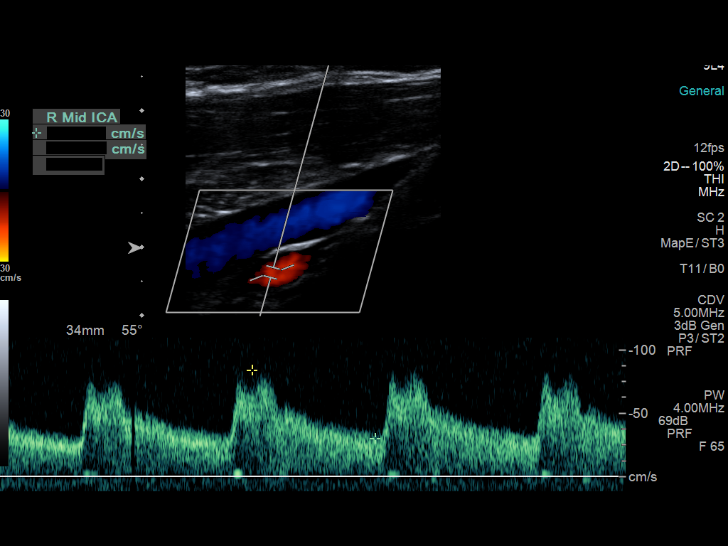
[im 27/52]
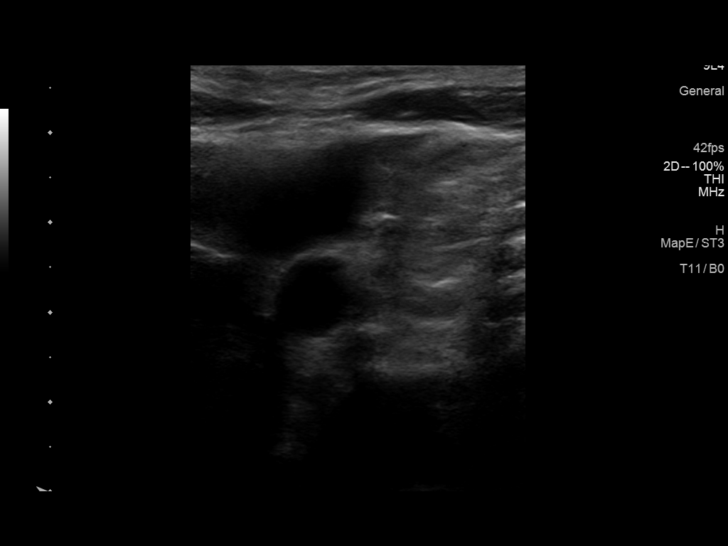
[im 29/52]
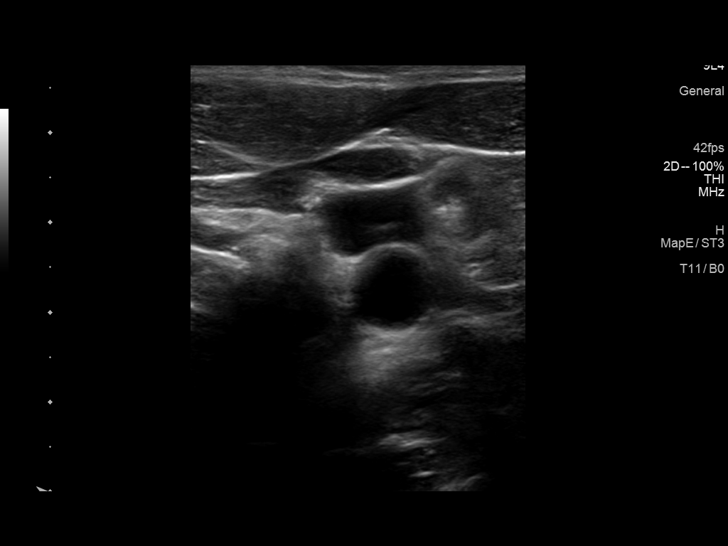
[im 34/52]
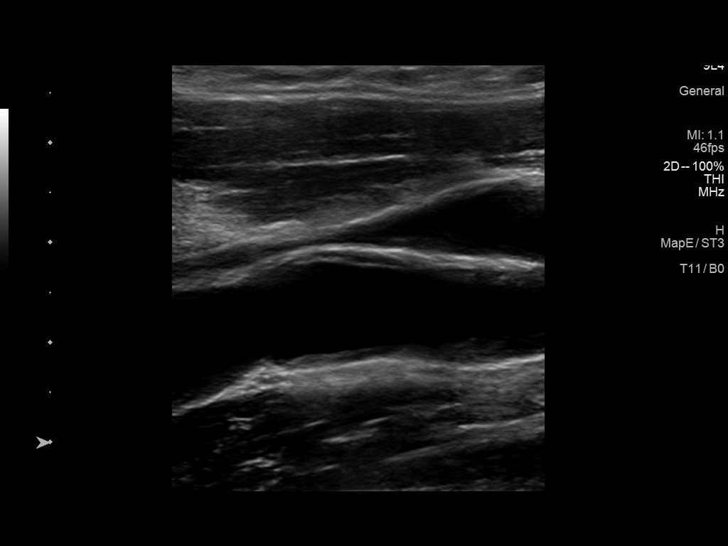
[im 38/52]
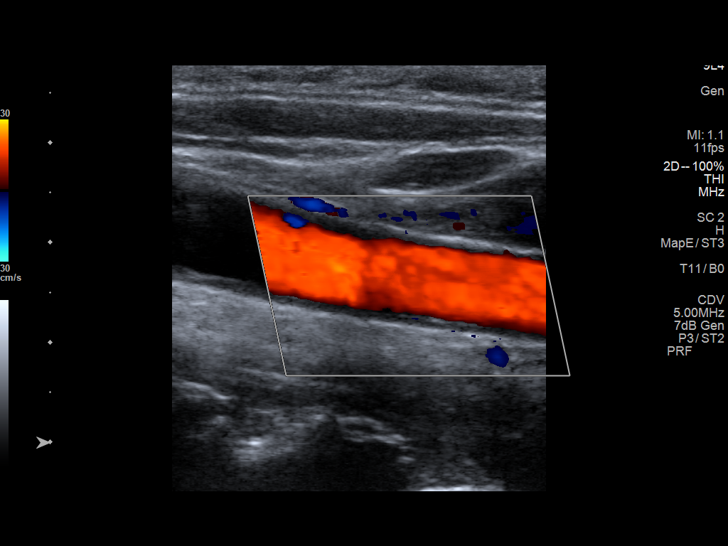
[im 43/52]
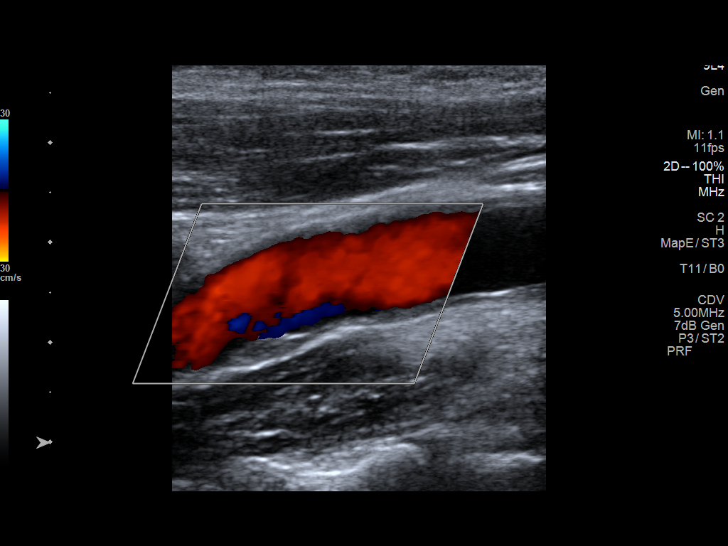
[im 47/52]
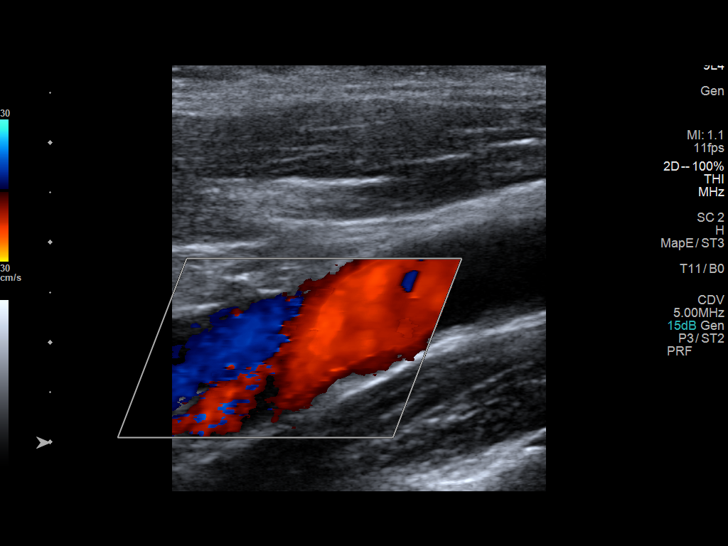
[im 52/52]
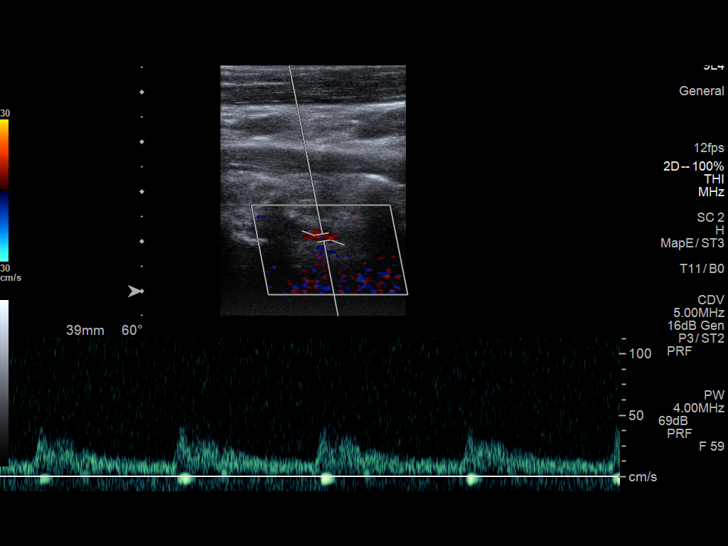

[13 of 24 positions shown; findings below may reference images not displayed]

FINDINGS: Criteria: Quantification of carotid stenosis is based on velocity
parameters that correlate the residual internal carotid diameter
with NASCET-based stenosis levels, using the diameter of the distal
internal carotid lumen as the denominator for stenosis measurement.

The following velocity measurements were obtained:

RIGHT

ICA: Peak systolic velocity 95 cm/sec, End diastolic velocity 30
cm/sec

CCA: Peak systolic velocity 167 cm/sec

SYSTOLIC ICA/CCA RATIO:

ECA: Peak systolic velocity 77 cm/sec

LEFT

ICA: Peak systolic velocity 98 cm/sec, End diastolic velocity 37
cm/sec

CCA: 141 cm/sec

SYSTOLIC ICA/CCA RATIO:

ECA: 105 cm/sec

RIGHT CAROTID ARTERY: No atherosclerotic plaque formation. No
significant tortuosity. Normal low resistance waveforms.

RIGHT VERTEBRAL ARTERY:  Antegrade flow.

LEFT CAROTID ARTERY: No atherosclerotic plaque formation. No
significant tortuosity. Normal low resistance waveforms.

LEFT VERTEBRAL ARTERY:  Antegrade flow.

Upper extremity non-invasive blood pressures:

Right: 152 mm/Hg systolic

Left: 150 mm/Hg systolic
IMPRESSION: 1. Right carotid artery system: Patent without significant
atherosclerotic plaque formation.

2. Left carotid artery system: Patent without significant
atherosclerotic plaque formation.

3.  Vertebral artery system: Patent with antegrade flow bilaterally.

## 2022-06-09 DIAGNOSIS — Z0289 Encounter for other administrative examinations: Secondary | ICD-10-CM

## 2022-06-11 ENCOUNTER — Encounter (INDEPENDENT_AMBULATORY_CARE_PROVIDER_SITE_OTHER): Payer: Medicaid Other | Admitting: Family Medicine

## 2022-07-03 ENCOUNTER — Ambulatory Visit (INDEPENDENT_AMBULATORY_CARE_PROVIDER_SITE_OTHER): Payer: Medicaid Other | Admitting: Family Medicine

## 2022-07-17 ENCOUNTER — Ambulatory Visit (INDEPENDENT_AMBULATORY_CARE_PROVIDER_SITE_OTHER): Payer: Medicaid Other | Admitting: Family Medicine

## 2022-07-30 ENCOUNTER — Encounter (INDEPENDENT_AMBULATORY_CARE_PROVIDER_SITE_OTHER): Payer: Self-pay | Admitting: Family Medicine

## 2022-07-30 ENCOUNTER — Ambulatory Visit (INDEPENDENT_AMBULATORY_CARE_PROVIDER_SITE_OTHER): Payer: Medicaid Other | Admitting: Family Medicine

## 2022-07-30 VITALS — BP 160/88 | HR 67 | Temp 98.3°F | Ht 74.0 in | Wt 336.0 lb

## 2022-07-30 DIAGNOSIS — R739 Hyperglycemia, unspecified: Secondary | ICD-10-CM

## 2022-07-30 DIAGNOSIS — F32A Depression, unspecified: Secondary | ICD-10-CM

## 2022-07-30 DIAGNOSIS — Z6839 Body mass index (BMI) 39.0-39.9, adult: Secondary | ICD-10-CM

## 2022-07-30 DIAGNOSIS — R5383 Other fatigue: Secondary | ICD-10-CM

## 2022-07-30 DIAGNOSIS — G478 Other sleep disorders: Secondary | ICD-10-CM

## 2022-07-30 DIAGNOSIS — E559 Vitamin D deficiency, unspecified: Secondary | ICD-10-CM

## 2022-07-30 DIAGNOSIS — Z6841 Body Mass Index (BMI) 40.0 and over, adult: Secondary | ICD-10-CM

## 2022-07-30 DIAGNOSIS — R0602 Shortness of breath: Secondary | ICD-10-CM

## 2022-07-30 DIAGNOSIS — F419 Anxiety disorder, unspecified: Secondary | ICD-10-CM

## 2022-07-30 DIAGNOSIS — I10 Essential (primary) hypertension: Secondary | ICD-10-CM | POA: Diagnosis not present

## 2022-07-30 DIAGNOSIS — M5416 Radiculopathy, lumbar region: Secondary | ICD-10-CM

## 2022-07-30 DIAGNOSIS — E669 Obesity, unspecified: Secondary | ICD-10-CM

## 2022-07-30 DIAGNOSIS — E7800 Pure hypercholesterolemia, unspecified: Secondary | ICD-10-CM

## 2022-07-30 NOTE — Progress Notes (Signed)
Chief Complaint:   OBESITY Edward Johnson (MR# [id number]) is a 52 y.o. male who presents for evaluation and treatment of obesity and related comorbidities. Current BMI is Body mass index is 43.14 kg/m. Edward Johnson has been struggling with his weight for many years and has been unsuccessful in either losing weight, maintaining weight loss, or reaching his healthy weight goal.  Edward Johnson is currently in the action stage of change and ready to dedicate time achieving and maintaining a healthier weight. Edward Johnson is interested in becoming our patient and working on intensive lifestyle modifications including (but not limited to) diet and exercise for weight loss.  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  He was deciding between here and [employer]. Desired weight is 280lbs.  He suffers from very significant back pain and has been receiving lumbar epidural injections and is on chronic opiate therapy.  Hasn't been at 280lbs in 20 years. Thinks the reason he started gaining was over eating. Eats fast food or take out 5x a week.  Eats at [employer] or [employer], [employer] or [employer] between lunch and dinner.  Usually only eats 1x a day because he feels content after the food choices at those places. It is unlikely for him to cook at home.  Wakes up in the am and eats banana or apple and coffee with creamer and splenda (1/4cup creamer in 32oz).  Normally eats around 3-7pm from one of above restaurants.  At [employer] does mccrispy chicken sandwich combo (eats all sandwich and fries and drinks all diet soda).  At [facility] A will do regular chick fil a combo and eat all sandwich with mayo and pickles and waffle fries.  Edward Johnson's habits were reviewed today and are as follows: His family eats meals together, his desired weight loss is 56 lbs, he has been heavy most of his life, he started gaining weight 30 years, his heaviest weight ever was 370 pounds, he has significant food cravings issues, he wakes  up frequently in the middle of the night to eat, he skips meals frequently, he is frequently drinking liquids with calories, he frequently makes poor food choices, he frequently eats larger portions than normal, and he struggles with emotional eating.  Depression Screen [name]'s Food and Mood (modified PHQ-9) score was 22.  Subjective:   1. Other fatigue [name] admits to daytime somnolence and admits to waking up still tired. Patient has a history of symptoms of daytime fatigue and morning fatigue. [name] generally gets 4 or 5 hours of sleep per night, and states that he has nightime awakenings. Snoring is present. Apneic episodes are not present. Epworth Sleepiness Score is 12.  EKG done on 02/17/2022, normal sinus rhythm.  2. SOBOE (shortness of breath on exertion) Jerett notes increasing shortness of breath with exercising and seems to be worsening over time with weight gain. He notes getting out of breath sooner with activity than he used to. This has not gotten worse recently. Alisha denies shortness of breath at rest or orthopnea.  3. Primary hypertension Edward Johnson is on lisinopril 20 mg daily and amlodipine 10 mg daily.  His blood pressure is elevated today.  4. Hyperglycemia Edward Johnson had elevated blood sugars previously.  His A1c from years ago was within normal limits.  5. Pure hypercholesterolemia Edward Johnson's last LDL in epic was from 12 years ago.  He is not on medications.  6. Vitamin D deficiency Edward Johnson's diagnosis is likely given obesity.  He is not  on supplementation, and he notes fatigue.  7. Poor sleep pattern Edward Johnson reports consistent fatigue but he does not take naps.  He is only sleeping 4 hours per night.  8. Lumbar radiculopathy Edward Johnson sees pain management, and he has hydro and oxycodone as needed.  He gets epidural injections.  9. Anxiety and depression [name]'s modified PHQ-9 score is 23.  He previously was on Cymbalta but he stopped due to muscle twitches and jerks, and he did not  like how it made him feel.  Assessment/Plan:   1. Other fatigue Edward Johnson does feel that his weight is causing his energy to be lower than it should be. Fatigue may be related to obesity, depression or many other causes. Labs will be ordered, and in the meanwhile, Edward Johnson will focus on self care including making healthy food choices, increasing physical activity and focusing on stress reduction.  - Thyroid Panel With TSH  2. SOBOE (shortness of breath on exertion) Edward Johnson does feel that he gets out of breath more easily that he used to when he exercises. Edward Johnson's shortness of breath appears to be obesity related and exercise induced. He has agreed to work on weight loss and gradually increase exercise to treat his exercise induced shortness of breath. Will continue to monitor closely.  - CBC w/Diff/Platelet  3. Primary hypertension We will check labs today, and we will follow-up at [facility] next appointment.  - Comprehensive metabolic panel  4. Hyperglycemia We will check labs today, and we will follow-up at [facility] next appointment.  - Hemoglobin A1c - Insulin, random  5. Pure hypercholesterolemia We will check labs today, and we will follow-up at [facility] next appointment.  - Lipid Panel With LDL/HDL Ratio  6. Vitamin D deficiency We will check labs today, and we will follow-up at [facility] next appointment.  - VITAMIN D 25 Hydroxy (Vit-D Deficiency, Fractures)  7. Poor sleep pattern Sleep hygiene was discussed and sleep handout was given to the patient today.  8. Lumbar radiculopathy Follow-up on plan for management from pain management.  9. Anxiety and depression We will follow-up on his symptoms at his next appointment.  10. BMI 39.0-39.9,adult  11. Obesity with starting BMI of 39.9 [name] is currently in the action stage of change and his goal is to continue with weight loss efforts. I recommend Edward Johnson begin the structured treatment plan as follows:  He has agreed to keeping a  food journal and adhering to recommended goals of 1200-1300 calories and 85+ grams of protein daily.  Exercise goals: No exercise has been prescribed at this time.   Behavioral modification strategies: increasing lean protein intake, no skipping meals, meal planning and cooking strategies, and planning for success.  He was informed of the importance of frequent follow-up visits to maximize his success with intensive lifestyle modifications for his multiple health conditions. He was informed we would discuss his lab results at his next visit unless there is a critical issue that needs to be addressed sooner. Rajendra agreed to keep his next visit at the agreed upon time to discuss these results.  Objective:   Blood pressure (!) 160/88, pulse 67, temperature 98.3 F (36.8 C), height 6\' 2"  (1.88 m), weight (!) 336 lb (152.4 kg), SpO2 98 %. Body mass index is 43.14 kg/m.  EKG: Normal sinus rhythm, rate 76.  Indirect Calorimeter completed today shows a VO2 of 194 and a REE of 1339.  His calculated basal metabolic rate is 1610 thus his basal metabolic rate is worse than expected.  General: Cooperative, alert, well developed, in no acute distress. HEENT: Conjunctivae and lids unremarkable. Cardiovascular: Regular rhythm.  Lungs: Normal work of breathing. Neurologic: No focal deficits.   Lab Results  Component Value Date   CREATININE 0.92 07/30/2022   BUN 15 07/30/2022   NA 141 07/30/2022   K 4.4 07/30/2022   CL 103 07/30/2022   CO2 23 07/30/2022   Lab Results  Component Value Date   ALT 25 07/30/2022   AST 25 07/30/2022   ALKPHOS 67 07/30/2022   BILITOT 0.5 07/30/2022   Lab Results  Component Value Date   HGBA1C 5.3 07/30/2022   HGBA1C 5.6 07/28/2013   HGBA1C 5.5 01/28/2011   Lab Results  Component Value Date   INSULIN 23.5 07/30/2022   Lab Results  Component Value Date   TSH 2.880 07/27/2013   Lab Results  Component Value Date   CHOL 240 (H) 07/30/2022   HDL 66  07/30/2022   LDLCALC 154 (H) 07/30/2022   TRIG 116 07/30/2022   CHOLHDL 4.6 01/29/2011   Lab Results  Component Value Date   WBC 6.9 02/17/2022   HGB 12.5 (L) 02/17/2022   HCT 37.2 (L) 02/17/2022   MCV 95 02/17/2022   PLT 319 02/17/2022   No results found for: "IRON", "TIBC", "FERRITIN"  Attestation Statements:   Reviewed by clinician on day of visit: allergies, medications, problem list, medical history, surgical history, family history, social history, and previous encounter notes.  Time spent on visit including pre-visit chart review and post-visit charting and care was 60 minutes.   I, Burt Knack, am acting as transcriptionist for Reuben Likes, MD.  This is the patient's first visit at [facility]. The patient's NEW PATIENT PACKET was reviewed at length. Included in the packet: current and past health history, medications, allergies, ROS, gynecologic history (women only), surgical history, family history, social history, weight history, weight loss surgery history (for those that have had weight loss surgery), nutritional evaluation, mood and food questionnaire, PHQ9, Epworth questionnaire, sleep habits questionnaire, patient life and health improvement goals questionnaire. These will all be scanned into the patient's chart under media.   During the visit, I independently reviewed the patient's EKG, bioimpedance scale results, and indirect calorimeter results. I used this information to tailor a meal plan for the patient that will help him to lose weight and will improve his obesity-related conditions going forward. I performed a medically necessary appropriate examination and/or evaluation. I discussed the assessment and treatment plan with the patient. The patient was provided an opportunity to ask questions and all were answered. The patient agreed with the plan and demonstrated an understanding of the instructions. Labs were ordered at this visit and will be  reviewed at the next visit unless more critical results need to be addressed immediately. Clinical information was updated and documented in the EMR.    I have reviewed the above documentation for accuracy and completeness, and I agree with the above. - Reuben Likes, MD

## 2022-07-31 LAB — COMPREHENSIVE METABOLIC PANEL
ALT: 25 IU/L (ref 0–44)
AST: 25 IU/L (ref 0–40)
Albumin/Globulin Ratio: 1.6 (ref 1.2–2.2)
Albumin: 4.6 g/dL (ref 3.8–4.9)
Alkaline Phosphatase: 67 IU/L (ref 44–121)
BUN/Creatinine Ratio: 16 (ref 9–20)
BUN: 15 mg/dL (ref 6–24)
Bilirubin Total: 0.5 mg/dL (ref 0.0–1.2)
CO2: 23 mmol/L (ref 20–29)
Calcium: 9.6 mg/dL (ref 8.7–10.2)
Chloride: 103 mmol/L (ref 96–106)
Creatinine, Ser: 0.92 mg/dL (ref 0.76–1.27)
Globulin, Total: 2.9 g/dL (ref 1.5–4.5)
Glucose: 89 mg/dL (ref 70–99)
Potassium: 4.4 mmol/L (ref 3.5–5.2)
Sodium: 141 mmol/L (ref 134–144)
Total Protein: 7.5 g/dL (ref 6.0–8.5)
eGFR: 101 mL/min/{1.73_m2} (ref >59)

## 2022-07-31 LAB — LIPID PANEL WITH LDL/HDL RATIO
Cholesterol, Total: 240 mg/dL — ABNORMAL HIGH (ref 100–199)
HDL: 66 mg/dL (ref >39)
LDL Chol Calc (NIH): 154 mg/dL — ABNORMAL HIGH (ref 0–99)
LDL/HDL Ratio: 2.3 ratio (ref 0.0–3.6)
Triglycerides: 116 mg/dL (ref 0–149)
VLDL Cholesterol Cal: 20 mg/dL (ref 5–40)

## 2022-07-31 LAB — VITAMIN D 25 HYDROXY (VIT D DEFICIENCY, FRACTURES): Vit D, 25-Hydroxy: 19 ng/mL — ABNORMAL LOW (ref 30.0–100.0)

## 2022-07-31 LAB — INSULIN, RANDOM: INSULIN: 23.5 u[IU]/mL (ref 2.6–24.9)

## 2022-07-31 LAB — HEMOGLOBIN A1C
Est. average glucose Bld gHb Est-mCnc: 105 mg/dL
Hgb A1c MFr Bld: 5.3 % (ref 4.8–5.6)

## 2022-08-13 ENCOUNTER — Encounter (INDEPENDENT_AMBULATORY_CARE_PROVIDER_SITE_OTHER): Payer: Self-pay | Admitting: Family Medicine

## 2022-08-13 ENCOUNTER — Ambulatory Visit (INDEPENDENT_AMBULATORY_CARE_PROVIDER_SITE_OTHER): Payer: Medicaid Other | Admitting: Family Medicine

## 2022-08-13 VITALS — BP 130/80 | HR 63 | Temp 98.5°F | Ht 74.0 in | Wt 334.0 lb

## 2022-08-13 DIAGNOSIS — E88819 Insulin resistance, unspecified: Secondary | ICD-10-CM | POA: Diagnosis not present

## 2022-08-13 DIAGNOSIS — E669 Obesity, unspecified: Secondary | ICD-10-CM

## 2022-08-13 DIAGNOSIS — E559 Vitamin D deficiency, unspecified: Secondary | ICD-10-CM | POA: Diagnosis not present

## 2022-08-13 DIAGNOSIS — E7800 Pure hypercholesterolemia, unspecified: Secondary | ICD-10-CM | POA: Diagnosis not present

## 2022-08-13 DIAGNOSIS — Z6841 Body Mass Index (BMI) 40.0 and over, adult: Secondary | ICD-10-CM

## 2022-08-13 MED ORDER — ROSUVASTATIN CALCIUM 5 MG PO TABS
5.0000 mg | ORAL_TABLET | Freq: Every day | ORAL | 3 refills | Status: DC
Start: 2022-08-13 — End: 2022-12-02

## 2022-08-13 MED ORDER — VITAMIN D (ERGOCALCIFEROL) 1.25 MG (50000 UNIT) PO CAPS
50000.0000 [IU] | ORAL_CAPSULE | ORAL | 0 refills | Status: DC
Start: 2022-08-13 — End: 2022-09-01

## 2022-08-13 NOTE — Progress Notes (Signed)
Chief Complaint:   OBESITY Edward Johnson is here to discuss his progress with his obesity treatment plan along with follow-up of his obesity related diagnoses. Edward Johnson is on keeping a food journal and adhering to recommended goals of 1200-1300 calories and 85+ protein and states he is following his eating plan approximately 100% of the time. Edward Johnson states he is walking 60 minutes 7 times per week.  Today's visit was #: 2 Starting weight: 336 lb Starting date: 07/30/2022 Today's weight: 334 lb Today's date: 08/13/2022 Total lbs lost to date: 2 lb Total lbs lost since last in-office visit: 2 lb  Interim History: Patient voices it has been an adjustment over the last few weeks of staying within calories and achieving protein amount.  He was able to get his protein to the goal of 85g.  Brought in food log today and was able to get average of 1275 calories per day and 92g of protein daily. He has been hungry.  He has to be more mindful of what the food choices he made.  No upcoming plans for the next few weeks in terms of activities or events.  Stayed home [facility] Day.   Subjective:   1. Pure hypercholesterolemia Discussed labs with patient today.   Patient last LDL 154, triglycerides 116, HDL 56.  Patient is not on any medications.  10-year ASCVD risk 9.2%(.  Intermediate, optimal 4.1%) Mod, int. Statin encouraged.  2. Insulin resistance Last A1c was 5.3, insulin 23.5.  Patient is not on any medications.  3. Vitamin D deficiency New diagnosis.  Discussed labs with patient today. Patient is positive for fatigue.  Patient vitamin D level 19.0.  Assessment/Plan:   1. Pure hypercholesterolemia Repeat labs in 3 months.  Start- rosuvastatin (CRESTOR) 5 MG tablet; Take 1 tablet (5 mg total) by mouth daily.  Dispense: 90 tablet; Refill: 3  2. Insulin resistance Pathophysiology of insulin resistance, prediabetes, diabetes discussed with patient today.  Patient has not taken any medication at  this time.  Follow-up labs in 3 months.  3. Vitamin D deficiency Start- Vitamin D, Ergocalciferol, (DRISDOL) 1.25 MG (50000 UNIT) CAPS capsule; Take 1 capsule (50,000 Units total) by mouth every 7 (seven) days.  Dispense: 4 capsule; Refill: 0  4. BMI 40.0-44.9, adult (HCC)  5. Obesity with starting BMI of 43.1 Edward Johnson is currently in the action stage of change. As such, his goal is to continue with weight loss efforts. He has agreed to keeping a food journal and adhering to recommended goals of up to 1500 calories and 95+ protein daily.   Exercise goals: All adults should avoid inactivity. Some physical activity is better than none, and adults who participate in any amount of physical activity gain some health benefits.  Behavioral modification strategies: increasing lean protein intake, no skipping meals, meal planning and cooking strategies, and planning for success.  Edward Johnson has agreed to follow-up with our clinic in 2 weeks. He was informed of the importance of frequent follow-up visits to maximize his success with intensive lifestyle modifications for his multiple health conditions.   Objective:   Blood pressure 130/80, pulse 63, temperature 98.5 F (36.9 C), height 6\' 2"  (1.88 m), weight (!) 334 lb (151.5 kg), SpO2 98 %. Body mass index is 42.88 kg/m.  General: Cooperative, alert, well developed, in no acute distress. HEENT: Conjunctivae and lids unremarkable. Cardiovascular: Regular rhythm.  Lungs: Normal work of breathing. Neurologic: No focal deficits.   Lab Results  Component Value Date  CREATININE 0.92 07/30/2022   BUN 15 07/30/2022   NA 141 07/30/2022   K 4.4 07/30/2022   CL 103 07/30/2022   CO2 23 07/30/2022   Lab Results  Component Value Date   ALT 25 07/30/2022   AST 25 07/30/2022   ALKPHOS 67 07/30/2022   BILITOT 0.5 07/30/2022   Lab Results  Component Value Date   HGBA1C 5.3 07/30/2022   HGBA1C 5.6 07/28/2013   HGBA1C 5.5 01/28/2011   Lab Results   Component Value Date   INSULIN 23.5 07/30/2022   Lab Results  Component Value Date   TSH 2.880 07/27/2013   Lab Results  Component Value Date   CHOL 240 (H) 07/30/2022   HDL 66 07/30/2022   LDLCALC 154 (H) 07/30/2022   TRIG 116 07/30/2022   CHOLHDL 4.6 01/29/2011   Lab Results  Component Value Date   VD25OH 19.0 (L) 07/30/2022   Lab Results  Component Value Date   WBC 6.9 02/17/2022   HGB 12.5 (L) 02/17/2022   HCT 37.2 (L) 02/17/2022   MCV 95 02/17/2022   PLT 319 02/17/2022   No results found for: "IRON", "TIBC", "FERRITIN"  Attestation Statements:   Reviewed by clinician on day of visit: allergies, medications, problem list, medical history, surgical history, family history, social history, and previous encounter notes.  Time spent on visit including pre-visit chart review and post-visit care and charting was 45 minutes.   I, Edward Johnson, RMA, am acting as transcriptionist for Edward Likes, MD.  I have reviewed the above documentation for accuracy and completeness, and I agree with the above. - Edward Likes, MD

## 2022-08-24 NOTE — Progress Notes (Deleted)
Cardiology Office Note:   Date:  08/24/2022  NAME:  Edward Johnson    MRN: [id number] DOB:  [birth year]   PCP:  Tracey Harries, MD  Cardiologist:  Reatha Harps, MD  Electrophysiologist:  None   Referring MD: Tracey Harries, MD   No chief complaint on file. ***  History of Present Illness:   Edward Johnson is a 52 y.o. male with a hx of pAF, obesity, OSA who presents for follow-up.  Problem List Paroxysmal Afib -DCCV 2005, 2007, 2012, 2015 2. Morbid obesity 3. HTN 4. OSA  Past Medical History: Past Medical History:  Diagnosis Date   Anxiety    Atrial fibrillation (HCC)    history of paroxysmal afib, s/p TEE cardioversion (2005, 2007, 2012), and chemical cardioversion with flecainide (2005, 2011)   Back pain    Depression    History of noncompliance with medical treatment    Hypertension    Morbid obesity [facility])     Past Surgical History: Past Surgical History:  Procedure Laterality Date   CARDIOVERSION  01/29/2011   Procedure: CARDIOVERSION;  Surgeon: Lewayne Bunting, MD;  Location: [facility] ENDOSCOPY;  Service: Cardiovascular;  Laterality: N/A;   CARDIOVERSION N/A 07/28/2013   Procedure: CARDIOVERSION;  Surgeon: Thurmon Fair, MD;  Location: [facility] ENDOSCOPY;  Service: Cardiovascular;  Laterality: N/A;   TEE WITHOUT CARDIOVERSION  01/29/2011   Procedure: TRANSESOPHAGEAL ECHOCARDIOGRAM (TEE);  Surgeon: Lewayne Bunting, MD;  Location: [facility] ENDOSCOPY;  Service: Cardiovascular;  Laterality: N/A;   TEE WITHOUT CARDIOVERSION N/A 07/28/2013   Procedure: TRANSESOPHAGEAL ECHOCARDIOGRAM (TEE);  Surgeon: Thurmon Fair, MD;  Location: [facility] ENDOSCOPY;  Service: Cardiovascular;  Laterality: N/A;  Supposed to be TEE/Cardioversion, Trish will call anesthesia in the morning.     Current Medications: No outpatient medications have been marked as taking for the 08/25/22 encounter (Appointment) with O'Neal, Ronnald Ramp, MD.     Allergies:    Patient has no known allergies.   Social  History: Social History   Socioeconomic History   Marital status: Married    Spouse name: Not on file   Number of children: Not on file   Years of education: Not on file   Highest education level: Not on file  Occupational History   Not on file  Tobacco Use   Smoking status: Never   Smokeless tobacco: Never  Vaping Use   Vaping Use: Never used  Substance and Sexual Activity   Alcohol use: No    Comment: OCCASIONAL   Drug use: No   Sexual activity: Not on file  Other Topics Concern   Not on file  Social History Narrative   Not on file   Social Determinants of Health   Financial Resource Strain: Not on file  Food Insecurity: Not on file  Transportation Needs: Not on file  Physical Activity: Not on file  Stress: Not on file  Social Connections: Not on file     Family History: The patient's ***family history includes Cancer in his mother; Cancer - Other in his mother; Hypertension in his mother. There is no history of CAD.  ROS:   All other ROS reviewed and negative. Pertinent positives noted in the HPI.     EKGs/Labs/Other Studies Reviewed:   The following studies were personally reviewed by me today:  EKG:  EKG is *** ordered today.  The ekg ordered today demonstrates ***, and was personally reviewed by me.   Recent Labs: 02/17/2022: Hemoglobin 12.5; Platelets 319 07/30/2022: ALT 25; BUN 15; Creatinine, Ser 0.92;  Potassium 4.4; Sodium 141   Recent Lipid Panel    Component Value Date/Time   CHOL 240 (H) 07/30/2022 0932   TRIG 116 07/30/2022 0932   HDL 66 07/30/2022 0932   CHOLHDL 4.6 01/29/2011 0602   VLDL 21 01/29/2011 0602   LDLCALC 154 (H) 07/30/2022 0932    Physical Exam:   VS:  There were no vitals taken for this visit.   Wt Readings from Last 3 Encounters:  08/13/22 (!) 334 lb (151.5 kg)  07/30/22 (!) 336 lb (152.4 kg)  02/17/22 (!) 334 lb 6.4 oz (151.7 kg)    General: Well nourished, well developed, in no acute distress Head: Atraumatic, normal  size  Eyes: PEERLA, EOMI  Neck: Supple, no JVD Endocrine: No thryomegaly Cardiac: Normal S1, S2; RRR; no murmurs, rubs, or gallops Lungs: Clear to auscultation bilaterally, no wheezing, rhonchi or rales  Abd: Soft, nontender, no hepatomegaly  Ext: No edema, pulses 2+ Musculoskeletal: No deformities, BUE and BLE strength normal and equal Skin: Warm and dry, no rashes   Neuro: Alert and oriented to person, place, time, and situation, CNII-XII grossly intact, no focal deficits  Psych: Normal mood and affect   ASSESSMENT:   Edward Johnson is a 52 y.o. male who presents for the following: No diagnosis found.  PLAN:   There are no diagnoses linked to this encounter.  {Are you ordering a CV Procedure (e.g. stress test, cath, DCCV, TEE, etc)?   Press F2        :[id number]}  Disposition: No follow-ups on file.  Medication Adjustments/Labs and Tests Ordered: Current medicines are reviewed at length with the patient today.  Concerns regarding medicines are outlined above.  No orders of the defined types were placed in this encounter.  No orders of the defined types were placed in this encounter.   There are no Patient Instructions on file for this visit.   Time Spent with Patient: I have spent a total of *** minutes with patient reviewing hospital notes, telemetry, EKGs, labs and examining the patient as well as establishing an assessment and plan that was discussed with the patient.  > 50% of time was spent in direct patient care.  Signed, Lenna Gilford. Flora Lipps, MD, [facility]  [facility]  [address] [place], [place] [address] [phone]  08/24/2022 7:15 PM

## 2022-08-25 ENCOUNTER — Ambulatory Visit: Payer: Medicaid Other | Admitting: Cardiovascular Disease

## 2022-08-25 DIAGNOSIS — I480 Paroxysmal atrial fibrillation: Secondary | ICD-10-CM

## 2022-08-25 DIAGNOSIS — I10 Essential (primary) hypertension: Secondary | ICD-10-CM

## 2022-09-01 ENCOUNTER — Ambulatory Visit (INDEPENDENT_AMBULATORY_CARE_PROVIDER_SITE_OTHER): Payer: Medicaid Other | Admitting: Family Medicine

## 2022-09-01 ENCOUNTER — Encounter (INDEPENDENT_AMBULATORY_CARE_PROVIDER_SITE_OTHER): Payer: Self-pay | Admitting: Family Medicine

## 2022-09-01 VITALS — BP 124/74 | HR 98 | Temp 98.2°F | Ht 74.0 in | Wt 327.0 lb

## 2022-09-01 DIAGNOSIS — Z6841 Body Mass Index (BMI) 40.0 and over, adult: Secondary | ICD-10-CM | POA: Diagnosis not present

## 2022-09-01 DIAGNOSIS — E559 Vitamin D deficiency, unspecified: Secondary | ICD-10-CM

## 2022-09-01 DIAGNOSIS — E7849 Other hyperlipidemia: Secondary | ICD-10-CM

## 2022-09-01 DIAGNOSIS — E669 Obesity, unspecified: Secondary | ICD-10-CM

## 2022-09-01 MED ORDER — VITAMIN D (ERGOCALCIFEROL) 1.25 MG (50000 UNIT) PO CAPS
50000.0000 [IU] | ORAL_CAPSULE | ORAL | 0 refills | Status: DC
Start: 2022-09-01 — End: 2022-12-02

## 2022-09-01 NOTE — Progress Notes (Signed)
Chief Complaint:   OBESITY Edward Johnson is here to discuss his progress with his obesity treatment plan along with follow-up of his obesity related diagnoses. Edward Johnson is on keeping a food journal and adhering to recommended goals of 1500 calories and 95+ grams of protein and states he is following his eating plan approximately 75% of the time. Edward Johnson states he is walking for 30 minutes 7 times per week.  Today's visit was #: 3 Starting weight: 336 lbs Starting date: 07/30/2022 Today's weight: 327 lbs Today's date: 09/01/2022 Total lbs lost to date: 9 Total lbs lost since last in-office visit: 7  Interim History: Patient has been trying to stick closer to calorie goal and attaining protein goal.  He is doing mostly steak and poultry and vegetables.  Has made changes to quantity and type of food he is getting in. Ending up around 1250 calories for the day and isn't necessarily sure of protein amount. No plans for 4th of July.   Subjective:   1. Other hyperlipidemia Patient is on Crestor daily.  He thought at 1 point he may have developed a rash from this but he thinks perhaps it was something else.  2. Vitamin D deficiency Patient is on prescription vitamin D.  He denies nausea, vomiting, or muscle weakness but notes fatigue.  Assessment/Plan:   1. Other hyperlipidemia Patient will continue Crestor 5 mg daily.  2. Vitamin D deficiency Patient will continue prescription vitamin D, we will refill for 1 month.  - Vitamin D, Ergocalciferol, (DRISDOL) 1.25 MG (50000 UNIT) CAPS capsule; Take 1 capsule (50,000 Units total) by mouth every 7 (seven) days.  Dispense: 4 capsule; Refill: 0  3. BMI 40.0-44.9, adult (HCC)  4. Obesity with starting BMI of 43.1 Edward Johnson is currently in the action stage of change. As such, his goal is to continue with weight loss efforts. He has agreed to keeping a food journal and adhering to recommended goals of 1500 calories and 95+ grams of protein daily.   Patient  is to work on consistently getting 1800 calories daily.  Exercise goals: As is.   Behavioral modification strategies: increasing lean protein intake, meal planning and cooking strategies, keeping healthy foods in the home, and planning for success.  Edward Johnson has agreed to follow-up with our clinic in 3 to 4 weeks. He was informed of the importance of frequent follow-up visits to maximize his success with intensive lifestyle modifications for his multiple health conditions.   Objective:   Blood pressure 124/74, pulse 98, temperature 98.2 F (36.8 C), height 6\' 2"  (1.88 m), weight (!) 327 lb (148.3 kg), SpO2 99 %. Body mass index is 41.98 kg/m.  General: Cooperative, alert, well developed, in no acute distress. HEENT: Conjunctivae and lids unremarkable. Cardiovascular: Regular rhythm.  Lungs: Normal work of breathing. Neurologic: No focal deficits.   Lab Results  Component Value Date   CREATININE 0.92 07/30/2022   BUN 15 07/30/2022   NA 141 07/30/2022   K 4.4 07/30/2022   CL 103 07/30/2022   CO2 23 07/30/2022   Lab Results  Component Value Date   ALT 25 07/30/2022   AST 25 07/30/2022   ALKPHOS 67 07/30/2022   BILITOT 0.5 07/30/2022   Lab Results  Component Value Date   HGBA1C 5.3 07/30/2022   HGBA1C 5.6 07/28/2013   HGBA1C 5.5 01/28/2011   Lab Results  Component Value Date   INSULIN 23.5 07/30/2022   Lab Results  Component Value Date   TSH 2.880 07/27/2013  Lab Results  Component Value Date   CHOL 240 (H) 07/30/2022   HDL 66 07/30/2022   LDLCALC 154 (H) 07/30/2022   TRIG 116 07/30/2022   CHOLHDL 4.6 01/29/2011   Lab Results  Component Value Date   VD25OH 19.0 (L) 07/30/2022   Lab Results  Component Value Date   WBC 6.9 02/17/2022   HGB 12.5 (L) 02/17/2022   HCT 37.2 (L) 02/17/2022   MCV 95 02/17/2022   PLT 319 02/17/2022   No results found for: "IRON", "TIBC", "FERRITIN"  Attestation Statements:   Reviewed by clinician on day of visit: allergies,  medications, problem list, medical history, surgical history, family history, social history, and previous encounter notes.   I, Burt Knack, am acting as transcriptionist for Reuben Likes, MD.  I have reviewed the above documentation for accuracy and completeness, and I agree with the above. - Reuben Likes, MD

## 2022-09-23 NOTE — Progress Notes (Deleted)
Cardiology Office Note:   Date:  09/23/2022  NAME:  Edward Johnson    MRN: [id number] DOB:  1971/02/02   PCP:  Tracey Harries, MD  Cardiologist:  Reatha Harps, MD  Electrophysiologist:  None   Referring MD: Tracey Harries, MD   No chief complaint on file.   History of Present Illness:   Edward Johnson is a 52 y.o. male with a hx of pAF, HTN, obesity who presents for follow-up.   ***  Problem List Paroxymal Afib -DCCV 2005, 2007, 2012, 2015 2. HTN 3. OSA  Past Medical History: Past Medical History:  Diagnosis Date   Anxiety    Atrial fibrillation (HCC)    history of paroxysmal afib, s/p TEE cardioversion (2005, 2007, 2012), and chemical cardioversion with flecainide (2005, 2011)   Back pain    Depression    History of noncompliance with medical treatment    Hypertension    Morbid obesity [facility])     Past Surgical History: Past Surgical History:  Procedure Laterality Date   CARDIOVERSION  01/29/2011   Procedure: CARDIOVERSION;  Surgeon: Lewayne Bunting, MD;  Location: [facility] ENDOSCOPY;  Service: Cardiovascular;  Laterality: N/A;   CARDIOVERSION N/A 07/28/2013   Procedure: CARDIOVERSION;  Surgeon: Thurmon Fair, MD;  Location: [facility] ENDOSCOPY;  Service: Cardiovascular;  Laterality: N/A;   TEE WITHOUT CARDIOVERSION  01/29/2011   Procedure: TRANSESOPHAGEAL ECHOCARDIOGRAM (TEE);  Surgeon: Lewayne Bunting, MD;  Location: [facility] ENDOSCOPY;  Service: Cardiovascular;  Laterality: N/A;   TEE WITHOUT CARDIOVERSION N/A 07/28/2013   Procedure: TRANSESOPHAGEAL ECHOCARDIOGRAM (TEE);  Surgeon: Thurmon Fair, MD;  Location: [facility] ENDOSCOPY;  Service: Cardiovascular;  Laterality: N/A;  Supposed to be TEE/Cardioversion, Trish will call anesthesia in the morning.     Current Medications: No outpatient medications have been marked as taking for the 09/25/22 encounter (Appointment) with O'Neal, Ronnald Ramp, MD.     Allergies:    Patient has no known allergies.   Social History: Social History    Socioeconomic History   Marital status: Married    Spouse name: Not on file   Number of children: Not on file   Years of education: Not on file   Highest education level: Not on file  Occupational History   Not on file  Tobacco Use   Smoking status: Never   Smokeless tobacco: Never  Vaping Use   Vaping Use: Never used  Substance and Sexual Activity   Alcohol use: No    Comment: OCCASIONAL   Drug use: No   Sexual activity: Not on file  Other Topics Concern   Not on file  Social History Narrative   Not on file   Social Determinants of Health   Financial Resource Strain: Not on file  Food Insecurity: Not on file  Transportation Needs: Not on file  Physical Activity: Not on file  Stress: Not on file  Social Connections: Not on file     Family History: The patient's family history includes Cancer in his mother; Cancer - Other in his mother; Hypertension in his mother. There is no history of CAD.  ROS:   All other ROS reviewed and negative. Pertinent positives noted in the HPI.     EKGs/Labs/Other Studies Reviewed:   The following studies were personally reviewed by me today:  EKG:  EKG is *** ordered today.        TTE 06/20/2019  1. Left ventricular ejection fraction, by estimation, is 60 to 65%. The  left ventricle has normal function. The left  ventricle has no regional  wall motion abnormalities. There is moderate concentric left ventricular  hypertrophy. Left ventricular  diastolic parameters were normal.   2. Right ventricular systolic function is normal. The right ventricular  size is normal.   3. The mitral valve is normal in structure. No evidence of mitral valve  regurgitation. No evidence of mitral stenosis.   4. The aortic valve is normal in structure. Aortic valve regurgitation is  not visualized. No aortic stenosis is present.   Recent Labs: 02/17/2022: Hemoglobin 12.5; Platelets 319 07/30/2022: ALT 25; BUN 15; Creatinine, Ser 0.92; Potassium 4.4;  Sodium 141   Recent Lipid Panel    Component Value Date/Time   CHOL 240 (H) 07/30/2022 0932   TRIG 116 07/30/2022 0932   HDL 66 07/30/2022 0932   CHOLHDL 4.6 01/29/2011 0602   VLDL 21 01/29/2011 0602   LDLCALC 154 (H) 07/30/2022 0932    Physical Exam:   VS:  There were no vitals taken for this visit.   Wt Readings from Last 3 Encounters:  09/01/22 (!) 327 lb (148.3 kg)  08/13/22 (!) 334 lb (151.5 kg)  07/30/22 (!) 336 lb (152.4 kg)    General: Well nourished, well developed, in no acute distress Head: Atraumatic, normal size  Eyes: PEERLA, EOMI  Neck: Supple, no JVD Endocrine: No thryomegaly Cardiac: Normal S1, S2; RRR; no murmurs, rubs, or gallops Lungs: Clear to auscultation bilaterally, no wheezing, rhonchi or rales  Abd: Soft, nontender, no hepatomegaly  Ext: No edema, pulses 2+ Musculoskeletal: No deformities, BUE and BLE strength normal and equal Skin: Warm and dry, no rashes   Neuro: Alert and oriented to person, place, time, and situation, CNII-XII grossly intact, no focal deficits  Psych: Normal mood and affect   ASSESSMENT:   Edward Johnson is a 52 y.o. male who presents for the following: No diagnosis found.  PLAN:   There are no diagnoses linked to this encounter.  {Are you ordering a CV Procedure (e.g. stress test, cath, DCCV, TEE, etc)?   Press F2        :[id number]}  Disposition: No follow-ups on file.  Medication Adjustments/Labs and Tests Ordered: Current medicines are reviewed at length with the patient today.  Concerns regarding medicines are outlined above.  No orders of the defined types were placed in this encounter.  No orders of the defined types were placed in this encounter.  There are no Patient Instructions on file for this visit.   Time Spent with Patient: I have spent a total of *** minutes with patient reviewing hospital notes, telemetry, EKGs, labs and examining the patient as well as establishing an assessment and plan that was  discussed with the patient.  > 50% of time was spent in direct patient care.  Signed, Lenna Gilford. Flora Lipps, MD, [facility]  [facility]  [address] [place], [place] [address] [phone]  09/23/2022 5:19 PM

## 2022-09-25 ENCOUNTER — Ambulatory Visit: Payer: Medicaid Other | Attending: Cardiovascular Disease | Admitting: Cardiovascular Disease

## 2022-09-25 DIAGNOSIS — I480 Paroxysmal atrial fibrillation: Secondary | ICD-10-CM

## 2022-09-25 DIAGNOSIS — I10 Essential (primary) hypertension: Secondary | ICD-10-CM

## 2022-09-29 ENCOUNTER — Ambulatory Visit (INDEPENDENT_AMBULATORY_CARE_PROVIDER_SITE_OTHER): Payer: Medicaid Other | Admitting: Family Medicine

## 2022-09-29 ENCOUNTER — Encounter (INDEPENDENT_AMBULATORY_CARE_PROVIDER_SITE_OTHER): Payer: Self-pay

## 2022-10-01 ENCOUNTER — Other Ambulatory Visit (INDEPENDENT_AMBULATORY_CARE_PROVIDER_SITE_OTHER): Payer: Self-pay | Admitting: Family Medicine

## 2022-10-01 DIAGNOSIS — E559 Vitamin D deficiency, unspecified: Secondary | ICD-10-CM

## 2022-10-30 ENCOUNTER — Ambulatory Visit (INDEPENDENT_AMBULATORY_CARE_PROVIDER_SITE_OTHER): Payer: Medicaid Other | Admitting: Family Medicine

## 2022-11-25 ENCOUNTER — Other Ambulatory Visit (INDEPENDENT_AMBULATORY_CARE_PROVIDER_SITE_OTHER): Payer: Self-pay | Admitting: Family Medicine

## 2022-11-25 DIAGNOSIS — E559 Vitamin D deficiency, unspecified: Secondary | ICD-10-CM

## 2022-12-02 ENCOUNTER — Ambulatory Visit (INDEPENDENT_AMBULATORY_CARE_PROVIDER_SITE_OTHER): Payer: Medicaid Other | Admitting: Adult Health

## 2022-12-02 ENCOUNTER — Encounter (INDEPENDENT_AMBULATORY_CARE_PROVIDER_SITE_OTHER): Payer: Self-pay | Admitting: Adult Health

## 2022-12-02 VITALS — BP 177/78 | HR 60 | Temp 97.8°F | Ht 74.0 in | Wt 332.0 lb

## 2022-12-02 DIAGNOSIS — E7800 Pure hypercholesterolemia, unspecified: Secondary | ICD-10-CM | POA: Diagnosis not present

## 2022-12-02 DIAGNOSIS — E559 Vitamin D deficiency, unspecified: Secondary | ICD-10-CM

## 2022-12-02 DIAGNOSIS — E669 Obesity, unspecified: Secondary | ICD-10-CM

## 2022-12-02 DIAGNOSIS — E88819 Insulin resistance, unspecified: Secondary | ICD-10-CM | POA: Diagnosis not present

## 2022-12-02 DIAGNOSIS — Z6841 Body Mass Index (BMI) 40.0 and over, adult: Secondary | ICD-10-CM

## 2022-12-02 DIAGNOSIS — I10 Essential (primary) hypertension: Secondary | ICD-10-CM

## 2022-12-02 MED ORDER — WEGOVY 0.25 MG/0.5ML ~~LOC~~ SOAJ: 0.2500 mg | SUBCUTANEOUS | 0 refills | Status: DC

## 2022-12-02 MED ORDER — ROSUVASTATIN CALCIUM 5 MG PO TABS: 5.0000 mg | ORAL_TABLET | Freq: Every day | ORAL | 3 refills | Status: DC

## 2022-12-02 MED ORDER — VITAMIN D (ERGOCALCIFEROL) 1.25 MG (50000 UNIT) PO CAPS
50000.0000 [IU] | ORAL_CAPSULE | ORAL | 0 refills | Status: DC
Start: 2022-12-02 — End: 2023-01-08

## 2022-12-02 NOTE — Progress Notes (Addendum)
WEIGHT SUMMARY AND BIOMETRICS  Vitals Temp: 97.8 F (36.6 C) BP: (!) 177/78 Pulse Rate: 60 SpO2: 98 %   Anthropometric Measurements Height: 6\' 2"  (1.88 m) Weight: (!) 332 lb (150.6 kg) BMI (Calculated): 42.61 Weight at Last Visit: 327lb Weight Lost Since Last Visit: 0 Weight Gained Since Last Visit: 5lb Starting Weight: 336lb Total Weight Loss (lbs): 4 lb (1.814 kg)   Body Composition  Body Fat %: 39.2 % Fat Mass (lbs): 130.4 lbs Muscle Mass (lbs): 192.4 lbs Total Body Water (lbs): 145.4 lbs Visceral Fat Rating : 25   Other Clinical Data Fasting: yes Labs: no Today's Visit #: 4 Starting Date: 07/30/22    Chief Complaint:   OBESITY Edward Johnson is here to discuss his progress with his obesity treatment plan. He is on the keeping a food journal and adhering to recommended goals of 1500 calories and 95g+ protein and states he is following his eating plan approximately 90 % of the time. He states he is exercising Walking 30 minutes 7 times per week.   Interim History:  He established with [facility] on 07/30/2022   07/30/22  RMR 1339   Started on Journaling program with 1200-1300 calories and 85+ grams of protein daily.   08/13/2022 OV at [facility]- meal plan was adjusted to 1500 cal and 95g +grams of protein to counteract persistent polyphagia.  He continues to experience breakthrough hunger even with increased calories and protein  Hydration-he estimates to drink 64-100 oz water/day  Subjective:   1. Primary hypertension BP above goal He reports taking Amlodipine 10mg  daily and Lisinopril 20mg  just 15 mins prior to OV He denies acute cardiac sx's at present  2. Insulin resistance He denies family hx of MEN 2 or MTC He denies personal hx of pancreatitis Discussed risks/benefits of Wegovy therapy  3. Vitamin D deficiency  Latest Reference Range & Units 07/30/22 09:32  Vitamin D, 25-Hydroxy 30.0 - 100.0 ng/mL 19.0 (L)  (L): Data is abnormally low  He was  recently started on weekly Ergocalciferol- denies N/V/Muscle Weakness  4. Pure hypercholesterolemia Lipid Panel     Component Value Date/Time   CHOL 240 (H) 07/30/2022 0932   TRIG 116 07/30/2022 0932   HDL 66 07/30/2022 0932   CHOLHDL 4.6 01/29/2011 0602   VLDL 21 01/29/2011 0602   LDLCALC 154 (H) 07/30/2022 0932   LABVLDL 20 07/30/2022 0932    1/61/0960 he was started on Crestor 5mg  daily He denies myalgias  Assessment/Plan:   1. Primary hypertension Limit Na+ intake Take antihypertensive regime as directed  2. Insulin resistance Start Wegovy therapy as directed.  3. Vitamin D deficiency Refill - Vitamin D, Ergocalciferol, (DRISDOL) 1.25 MG (50000 UNIT) CAPS capsule; Take 1 capsule (50,000 Units total) by mouth every 7 (seven) days.  Dispense: 4 capsule; Refill: 0  4. Pure hypercholesterolemia Refill - rosuvastatin (CRESTOR) 5 MG tablet; Take 1 tablet (5 mg total) by mouth daily.  Dispense: 90 tablet; Refill: 3  5. BMI 40.0-44.9, adult (HCC), Current BMI 42.61 Start Semaglutide-Weight Management (WEGOVY) 0.25 MG/0.5ML SOAJ Inject 0.25 mg into the skin once a week. Dispense: 2 mL, Refills: 0 ordered   Edward Johnson is currently in the action stage of change. As such, his goal is to continue with weight loss efforts. He has agreed to keeping a food journal and adhering to recommended goals of 1300 calories and 95g+ protein.   Exercise goals: All adults should avoid inactivity. Some physical activity is better than none, and adults who  participate in any amount of physical activity gain some health benefits. Adults should also include muscle-strengthening activities that involve all major muscle groups on 2 or more days a week.  Behavioral modification strategies: increasing lean protein intake, decreasing simple carbohydrates, increasing vegetables, increasing water intake, decreasing eating out, no skipping meals, meal planning and cooking strategies, avoiding temptations, planning  for success, and keeping a strict food journal.  Harce has agreed to follow-up with our clinic in 4 weeks. He was informed of the importance of frequent follow-up visits to maximize his success with intensive lifestyle modifications for his multiple health conditions.   Objective:   Blood pressure (!) 177/78, pulse 60, temperature 97.8 F (36.6 C), height 6\' 2"  (1.88 m), weight (!) 332 lb (150.6 kg), SpO2 98%. Body mass index is 42.63 kg/m.  General: Cooperative, alert, well developed, in no acute distress. HEENT: Conjunctivae and lids unremarkable. Cardiovascular: Regular rhythm.  Lungs: Normal work of breathing. Neurologic: No focal deficits.   Lab Results  Component Value Date   CREATININE 0.92 07/30/2022   BUN 15 07/30/2022   NA 141 07/30/2022   K 4.4 07/30/2022   CL 103 07/30/2022   CO2 23 07/30/2022   Lab Results  Component Value Date   ALT 25 07/30/2022   AST 25 07/30/2022   ALKPHOS 67 07/30/2022   BILITOT 0.5 07/30/2022   Lab Results  Component Value Date   HGBA1C 5.3 07/30/2022   HGBA1C 5.6 07/28/2013   HGBA1C 5.5 01/28/2011   Lab Results  Component Value Date   INSULIN 23.5 07/30/2022   Lab Results  Component Value Date   TSH 2.880 07/27/2013   Lab Results  Component Value Date   CHOL 240 (H) 07/30/2022   HDL 66 07/30/2022   LDLCALC 154 (H) 07/30/2022   TRIG 116 07/30/2022   CHOLHDL 4.6 01/29/2011   Lab Results  Component Value Date   VD25OH 19.0 (L) 07/30/2022   Lab Results  Component Value Date   WBC 6.9 02/17/2022   HGB 12.5 (L) 02/17/2022   HCT 37.2 (L) 02/17/2022   MCV 95 02/17/2022   PLT 319 02/17/2022   No results found for: "IRON", "TIBC", "FERRITIN"  Attestation Statements:   Reviewed by clinician on day of visit: allergies, medications, problem list, medical history, surgical history, family history, social history, and previous encounter notes.  I have reviewed the above documentation for accuracy and completeness, and I  agree with the above. -  Alfonso Carden d. Tarius Stangelo, NP-C

## 2022-12-03 ENCOUNTER — Encounter: Payer: Self-pay | Admitting: Podiatry

## 2022-12-03 ENCOUNTER — Ambulatory Visit (INDEPENDENT_AMBULATORY_CARE_PROVIDER_SITE_OTHER): Payer: Medicaid Other

## 2022-12-03 ENCOUNTER — Ambulatory Visit: Payer: Medicaid Other | Admitting: Podiatry

## 2022-12-03 DIAGNOSIS — M778 Other enthesopathies, not elsewhere classified: Secondary | ICD-10-CM | POA: Diagnosis not present

## 2022-12-03 DIAGNOSIS — M7752 Other enthesopathy of left foot: Secondary | ICD-10-CM

## 2022-12-03 MED ORDER — TRIAMCINOLONE ACETONIDE 10 MG/ML IJ SUSP
10.0000 mg | Freq: Once | INTRAMUSCULAR | Status: AC
Start: 2022-12-03 — End: 2022-12-03
  Administered 2022-12-03: 10 mg via INTRA_ARTICULAR

## 2022-12-03 MED ORDER — PREDNISONE 10 MG PO TABS: ORAL_TABLET | ORAL | 0 refills | Status: DC

## 2022-12-03 NOTE — Progress Notes (Signed)
Subjective:   Patient ID: Edward Johnson, male   DOB: 52 y.o.   MRN: [id number]   HPI Patient presents with a lot of pain in his left ankle and states that it has been hurting him for several months does not remember injury and he does stand at work for long hours.  Patient does not smoke likes to be active   Review of Systems  All other systems reviewed and are negative.       Objective:  Physical Exam Vitals and nursing note reviewed.  Constitutional:      Appearance: He is well-developed.  Pulmonary:     Effort: Pulmonary effort is normal.  Musculoskeletal:        General: Normal range of motion.  Skin:    General: Skin is warm.  Neurological:     Mental Status: He is alert.     Neurovascular status intact muscle strength found to be adequate range of motion adequate exquisite discomfort in the left ankle in the sinus tarsi mild reduction of motion no crepitus of the joint surface or other pathology.  Good digital perfusion well-oriented     Assessment:  Probability for inflammatory sinus tarsitis left cannot rule out other pathology with some medial pain which I am hoping is compensatory     Plan:  H&P reviewed all conditions sterile prep injected the sinus tarsi left 3 mg Kenalog 5 mg Xylocaine advised on ice therapy reappoint to recheck  X-rays were negative for signs of arthritis or fracture around this area appears to be soft tissue or inflammatory

## 2022-12-30 ENCOUNTER — Other Ambulatory Visit (INDEPENDENT_AMBULATORY_CARE_PROVIDER_SITE_OTHER): Payer: Self-pay | Admitting: Adult Health

## 2022-12-30 DIAGNOSIS — E559 Vitamin D deficiency, unspecified: Secondary | ICD-10-CM

## 2023-01-01 ENCOUNTER — Encounter (INDEPENDENT_AMBULATORY_CARE_PROVIDER_SITE_OTHER): Payer: Self-pay

## 2023-01-08 ENCOUNTER — Encounter (INDEPENDENT_AMBULATORY_CARE_PROVIDER_SITE_OTHER): Payer: Self-pay | Admitting: Adult Health

## 2023-01-08 ENCOUNTER — Ambulatory Visit (INDEPENDENT_AMBULATORY_CARE_PROVIDER_SITE_OTHER): Payer: Medicaid Other | Admitting: Adult Health

## 2023-01-08 VITALS — BP 155/96 | HR 62 | Temp 97.4°F | Ht 74.0 in | Wt 333.0 lb

## 2023-01-08 DIAGNOSIS — E669 Obesity, unspecified: Secondary | ICD-10-CM

## 2023-01-08 DIAGNOSIS — E88819 Insulin resistance, unspecified: Secondary | ICD-10-CM

## 2023-01-08 DIAGNOSIS — Z6841 Body Mass Index (BMI) 40.0 and over, adult: Secondary | ICD-10-CM | POA: Diagnosis not present

## 2023-01-08 DIAGNOSIS — E559 Vitamin D deficiency, unspecified: Secondary | ICD-10-CM | POA: Diagnosis not present

## 2023-01-08 MED ORDER — VITAMIN D (ERGOCALCIFEROL) 1.25 MG (50000 UNIT) PO CAPS
50000.0000 [IU] | ORAL_CAPSULE | ORAL | 0 refills | Status: DC
Start: 2023-01-08 — End: 2023-02-19

## 2023-01-08 MED ORDER — WEGOVY 0.5 MG/0.5ML ~~LOC~~ SOAJ: 0.5000 mg | SUBCUTANEOUS | 0 refills | Status: DC

## 2023-01-08 NOTE — Progress Notes (Signed)
WEIGHT SUMMARY AND BIOMETRICS  Vitals Temp: (!) 97.4 F (36.3 C) BP: (!) 155/96 Pulse Rate: 62 SpO2: 96 %   Anthropometric Measurements Height: 6\' 2"  (1.88 m) Weight: (!) 333 lb (151 kg) BMI (Calculated): 42.74 Weight at Last Visit: 332lb Weight Lost Since Last Visit: 0 Weight Gained Since Last Visit: 1lb Starting Weight: 336lb Total Weight Loss (lbs): 3 lb (1.361 kg)   Body Composition  Body Fat %: 39.1 % Fat Mass (lbs): 130.4 lbs Muscle Mass (lbs): 193.2 lbs Total Body Water (lbs): 142.8 lbs Visceral Fat Rating : 25   Other Clinical Data Fasting: no Labs: no Today's Visit #: 5 Starting Date: 07/30/22    Chief Complaint:   OBESITY Edward Johnson is here to discuss his progress with his obesity treatment plan. Edward Johnson is on the keeping a food journal and adhering to recommended goals of 1500 calories and 95+ protein and states Edward Johnson is following his eating plan approximately 80-90 % of the time. Edward Johnson states Edward Johnson is exercising Walking 30-60 minutes 4 times per week.   Interim History:  Edward Johnson was started on Wegovy 0.25mg  Due to drug shortage Edward Johnson was delayed in obtaining Rx. Edward Johnson was able to Find at [facility] on [address]  Edward Johnson has had one injection- Edward Johnson was unaware that Edward Johnson needed to continue injections each week Discussed proper use of Wegovy Denies mass in neck, dysphagia, dyspepsia, persistent hoarseness, abdominal pain, or N/V/C   Reviewed Bioimpedance results with Edward Johnson: Muscle Mass: +0.8 lb Adipose Mass: No Change  Of Note- Edward Johnson is requesting RF on Amlodipine and Methocarbamol- both are provided from his PCP- recommend Edward Johnson request from that office  Subjective:   1. Vitamin D deficiency  Latest Reference Range & Units 07/30/22 09:32  Vitamin D, 25-Hydroxy 30.0 - 100.0 ng/mL 19.0 (L)  (L): Data is abnormally low Edward Johnson endorses stable energy levels. Edward Johnson is on weekly Ergocalciferol- denies N/V/Muscle Weakness  2. Insulin resistance  Latest Reference Range &  Units 07/30/22 09:32  Glucose 70 - 99 mg/dL 89  Hemoglobin Z6X 4.8 - 5.6 % 5.3  Est. average glucose Bld gHb Est-mCnc mg/dL 096  INSULIN 2.6 - 04.5 uIU/mL 23.5   Edward Johnson was started on Wegovy 0.25mg  Due to drug shortage Edward Johnson was delayed in obtaining Rx. Edward Johnson was able to Find at [facility] on [address]  Edward Johnson has had one injection- Edward Johnson was unaware that Edward Johnson needed to continue injections each week Discussed proper use of Wegovy Denies mass in neck, dysphagia, dyspepsia, persistent hoarseness, abdominal pain, or N/V/C   Assessment/Plan:   1. Vitamin D deficiency Refill - Vitamin D, Ergocalciferol, (DRISDOL) 1.25 MG (50000 UNIT) CAPS capsule; Take 1 capsule (50,000 Units total) by mouth every 7 (seven) days.  Dispense: 4 capsule; Refill: 0  2. Insulin resistance Continue to increase daily protein intake and regular walking  4. Obesity with starting BMI of 43.1 After 3 more Wegovy 0.25mg  injections  Refill and increase on 01/29/23 Semaglutide-Weight Management (WEGOVY) 0.5 MG/0.5ML SOAJ Inject 0.5 mg into the skin once a week. Dispense: 2 mL, Refills: 0 ordered    Edward Johnson is currently in the action stage of change. As such, his goal is to continue with weight loss efforts. Edward Johnson has agreed to keeping a food journal and adhering to recommended goals of 1500 calories and 95+ protein.   Exercise goals: All adults should avoid inactivity. Some physical activity is better than none, and adults who participate in any amount  of physical activity gain some health benefits. Adults should also include muscle-strengthening activities that involve all major muscle groups on 2 or more days a week.  Behavioral modification strategies: increasing lean protein intake, decreasing simple carbohydrates, increasing vegetables, increasing water intake, no skipping meals, meal planning and cooking strategies, keeping healthy foods in the home, ways to avoid boredom eating, better snacking choices, and  planning for success.  Edward Johnson has agreed to follow-up with our clinic in 4 weeks. Edward Johnson was informed of the importance of frequent follow-up visits to maximize his success with intensive lifestyle modifications for his multiple health conditions.  Objective:   Blood pressure (!) 155/96, pulse 62, temperature (!) 97.4 F (36.3 C), height 6\' 2"  (1.88 m), weight (!) 333 lb (151 kg), SpO2 96%. Body mass index is 42.75 kg/m.  General: Cooperative, alert, well developed, in no acute distress. HEENT: Conjunctivae and lids unremarkable. Cardiovascular: Regular rhythm.  Lungs: Normal work of breathing. Neurologic: No focal deficits.   Lab Results  Component Value Date   CREATININE 0.92 07/30/2022   BUN 15 07/30/2022   NA 141 07/30/2022   K 4.4 07/30/2022   CL 103 07/30/2022   CO2 23 07/30/2022   Lab Results  Component Value Date   ALT 25 07/30/2022   AST 25 07/30/2022   ALKPHOS 67 07/30/2022   BILITOT 0.5 07/30/2022   Lab Results  Component Value Date   HGBA1C 5.3 07/30/2022   HGBA1C 5.6 07/28/2013   HGBA1C 5.5 01/28/2011   Lab Results  Component Value Date   INSULIN 23.5 07/30/2022   Lab Results  Component Value Date   TSH 2.880 07/27/2013   Lab Results  Component Value Date   CHOL 240 (H) 07/30/2022   HDL 66 07/30/2022   LDLCALC 154 (H) 07/30/2022   TRIG 116 07/30/2022   CHOLHDL 4.6 01/29/2011   Lab Results  Component Value Date   VD25OH 19.0 (L) 07/30/2022   Lab Results  Component Value Date   WBC 6.9 02/17/2022   HGB 12.5 (L) 02/17/2022   HCT 37.2 (L) 02/17/2022   MCV 95 02/17/2022   PLT 319 02/17/2022   No results found for: "IRON", "TIBC", "FERRITIN"  Attestation Statements:   Reviewed by clinician on day of visit: allergies, medications, problem list, medical history, surgical history, family history, social history, and previous encounter notes.  I have reviewed the above documentation for accuracy and completeness, and I agree with the above. -   Seva Chancy d. Khush Pasion, NP-C

## 2023-02-17 ENCOUNTER — Ambulatory Visit
Admission: RE | Admit: 2023-02-17 | Discharge: 2023-02-17 | Disposition: A | Discharge status: Home / Self Care | Payer: Medicaid Other | Source: Ambulatory Visit | Attending: Registered Nurse | Admitting: Registered Nurse

## 2023-02-17 ENCOUNTER — Other Ambulatory Visit: Payer: Self-pay | Admitting: Registered Nurse

## 2023-02-17 DIAGNOSIS — M25532 Pain in left wrist: Secondary | ICD-10-CM

## 2023-02-19 ENCOUNTER — Ambulatory Visit (INDEPENDENT_AMBULATORY_CARE_PROVIDER_SITE_OTHER): Payer: Medicaid Other | Admitting: Adult Health

## 2023-02-19 ENCOUNTER — Telehealth (INDEPENDENT_AMBULATORY_CARE_PROVIDER_SITE_OTHER): Payer: Self-pay | Admitting: Adult Health

## 2023-02-19 ENCOUNTER — Encounter (INDEPENDENT_AMBULATORY_CARE_PROVIDER_SITE_OTHER): Payer: Self-pay | Admitting: Adult Health

## 2023-02-19 DIAGNOSIS — E88819 Insulin resistance, unspecified: Secondary | ICD-10-CM

## 2023-02-19 DIAGNOSIS — Z6841 Body Mass Index (BMI) 40.0 and over, adult: Secondary | ICD-10-CM

## 2023-02-19 DIAGNOSIS — E669 Obesity, unspecified: Secondary | ICD-10-CM | POA: Diagnosis not present

## 2023-02-19 DIAGNOSIS — E559 Vitamin D deficiency, unspecified: Secondary | ICD-10-CM | POA: Diagnosis not present

## 2023-02-19 MED ORDER — VITAMIN D (ERGOCALCIFEROL) 1.25 MG (50000 UNIT) PO CAPS: 50000.0000 [IU] | ORAL_CAPSULE | ORAL | 0 refills | Status: DC

## 2023-02-19 MED ORDER — WEGOVY 0.5 MG/0.5ML ~~LOC~~ SOAJ: 0.5000 mg | SUBCUTANEOUS | 0 refills | Status: DC

## 2023-02-19 NOTE — Progress Notes (Signed)
WEIGHT SUMMARY AND BIOMETRICS  Vitals Temp: (!) 97.4 F (36.3 C) BP: (!) 153/79 Pulse Rate: 73 SpO2: 93 %   Anthropometric Measurements Height: 6\' 2"  (1.88 m) Weight: (!) 329 lb (149.2 kg) BMI (Calculated): 42.22 Weight at Last Visit: 333lb Weight Lost Since Last Visit: 4lb Weight Gained Since Last Visit: 0 Starting Weight: 336lb Total Weight Loss (lbs): 7 lb (3.175 kg)   Body Composition  Body Fat %: 37.7 % Fat Mass (lbs): 124.2 lbs Muscle Mass (lbs): 195.2 lbs Total Body Water (lbs): 139.6 lbs Visceral Fat Rating : 24   Other Clinical Data Fasting: no Labs: no Today's Visit #: 6 Starting Date: 07/30/22    Chief Complaint:   OBESITY Edward Johnson is here to discuss his progress with his obesity treatment plan. He is on the keeping a food journal and adhering to recommended goals of 1500 calories and 95+ protein and states he is following his eating plan approximately 80 % of the time. He states he is exercising Walking 30-60 minutes 4 times per week.   Interim History:  He was started on Wegovy 0.25mg  on/about 12/02/2022 He has titrated up to [facility] 0.5mg - has had 2 doses Denies mass in neck, dysphagia, dyspepsia, persistent hoarseness, abdominal pain, or N/V/C   Reviewed Bioimpedance results with pt: Muscle Mass: + 2 lbs Adipose Mass: - 6.2 lbs  Mr. Chihuahua provided the following food recall typical of  day: Breakfast: Smoked sausage breakfast sandwich Lunch: Skips Dinner: Steak or McDonald[employer] burgers  Exercise-walking with cane  Hydration-he estimates to drink 32-64 oz water/day. He will also have 2 diet sodas and 32 oz of coffee.  Subjective:   1. Insulin resistance  Latest Reference Range & Units 07/30/22 09:32  INSULIN 2.6 - 24.9 uIU/mL 23.5   He was started on Wegovy 0.25mg  on/about 12/02/2022 He has titrated up to [facility] 0.5mg - has had 2 doses Denies mass in neck, dysphagia, dyspepsia, persistent hoarseness, abdominal pain, or N/V/C   2.  Vitamin D deficiency He endorses stable energy levels He has been very consistent with weekly Ergocalciferol- denies N/V/Muscle Weakness  Assessment/Plan:   1. Insulin resistance Add in vegetables and low glycemic fruit Do not skip lunch  2. Vitamin D deficiency Refill - Vitamin D, Ergocalciferol, (DRISDOL) 1.25 MG (50000 UNIT) CAPS capsule; Take 1 capsule (50,000 Units total) by mouth every 7 (seven) days.  Dispense: 4 capsule; Refill: 0  3. BMI 40.0-44.9, adult (HCC), Current BMI 42.22 Refill Semaglutide-Weight Management (WEGOVY) 0.5 MG/0.5ML SOAJ Inject 0.5 mg into the skin once a week. Dispense: 2 mL, Refills: 0 ordered   Avram is currently in the action stage of change. As such, his goal is to continue with weight loss efforts. He has agreed to the Category 3 Plan and keeping a food journal and adhering to recommended goals of 1500 calories and 95+ protein.   Exercise goals: For substantial health benefits, adults should do at least 150 minutes (2 hours and 30 minutes) a week of moderate-intensity, or 75 minutes (1 hour and 15 minutes) a week of vigorous-intensity aerobic physical activity, or an equivalent combination of moderate- and vigorous-intensity aerobic activity. Aerobic activity should be performed in episodes of at least 10 minutes, and preferably, it should be spread throughout the week.  Behavioral modification strategies: increasing lean protein intake, decreasing simple carbohydrates, increasing vegetables, increasing water intake, no skipping meals, meal planning and cooking strategies, keeping healthy foods in the home, ways to avoid boredom eating, holiday eating strategies ,  and planning for success.  Hyatt has agreed to follow-up with our clinic in 4 weeks. He was informed of the importance of frequent follow-up visits to maximize his success with intensive lifestyle modifications for his multiple health conditions.   Check Fasting Labs at next OV  Objective:    Blood pressure (!) 153/79, pulse 73, temperature (!) 97.4 F (36.3 C), height 6\' 2"  (1.88 m), weight (!) 329 lb (149.2 kg), SpO2 93%. Body mass index is 42.24 kg/m.  General: Cooperative, alert, well developed, in no acute distress. HEENT: Conjunctivae and lids unremarkable. Cardiovascular: Regular rhythm.  Lungs: Normal work of breathing. Neurologic: No focal deficits.   Lab Results  Component Value Date   CREATININE 0.92 07/30/2022   BUN 15 07/30/2022   NA 141 07/30/2022   K 4.4 07/30/2022   CL 103 07/30/2022   CO2 23 07/30/2022   Lab Results  Component Value Date   ALT 25 07/30/2022   AST 25 07/30/2022   ALKPHOS 67 07/30/2022   BILITOT 0.5 07/30/2022   Lab Results  Component Value Date   HGBA1C 5.3 07/30/2022   HGBA1C 5.6 07/28/2013   HGBA1C 5.5 01/28/2011   Lab Results  Component Value Date   INSULIN 23.5 07/30/2022   Lab Results  Component Value Date   TSH 2.880 07/27/2013   Lab Results  Component Value Date   CHOL 240 (H) 07/30/2022   HDL 66 07/30/2022   LDLCALC 154 (H) 07/30/2022   TRIG 116 07/30/2022   CHOLHDL 4.6 01/29/2011   Lab Results  Component Value Date   VD25OH 19.0 (L) 07/30/2022   Lab Results  Component Value Date   WBC 6.9 02/17/2022   HGB 12.5 (L) 02/17/2022   HCT 37.2 (L) 02/17/2022   MCV 95 02/17/2022   PLT 319 02/17/2022   No results found for: "IRON", "TIBC", "FERRITIN"  Attestation Statements:   Reviewed by clinician on day of visit: allergies, medications, problem list, medical history, surgical history, family history, social history, and previous encounter notes.  I have reviewed the above documentation for accuracy and completeness, and I agree with the above. -  Velicia Dejager d. Sierra Spargo, NP-C

## 2023-02-19 NOTE — Telephone Encounter (Signed)
12/5 Pt called in stating he now uses the [facility] [phone]) in [place] for all of his prescriptions. Pt would like all prescriptions sent there.

## 2023-02-20 ENCOUNTER — Other Ambulatory Visit (INDEPENDENT_AMBULATORY_CARE_PROVIDER_SITE_OTHER): Payer: Self-pay | Admitting: Adult Health

## 2023-02-20 DIAGNOSIS — E559 Vitamin D deficiency, unspecified: Secondary | ICD-10-CM

## 2023-03-17 ENCOUNTER — Other Ambulatory Visit (INDEPENDENT_AMBULATORY_CARE_PROVIDER_SITE_OTHER): Payer: Self-pay | Admitting: Adult Health

## 2023-03-26 ENCOUNTER — Encounter (INDEPENDENT_AMBULATORY_CARE_PROVIDER_SITE_OTHER): Payer: Self-pay | Admitting: Adult Health

## 2023-03-26 ENCOUNTER — Ambulatory Visit (INDEPENDENT_AMBULATORY_CARE_PROVIDER_SITE_OTHER): Payer: Medicaid Other | Admitting: Adult Health

## 2023-03-26 VITALS — BP 198/68 | HR 69 | Temp 97.5°F | Ht 74.0 in | Wt 330.0 lb

## 2023-03-26 DIAGNOSIS — Z6839 Body mass index (BMI) 39.0-39.9, adult: Secondary | ICD-10-CM

## 2023-03-26 DIAGNOSIS — W19XXXD Unspecified fall, subsequent encounter: Secondary | ICD-10-CM

## 2023-03-26 DIAGNOSIS — E7800 Pure hypercholesterolemia, unspecified: Secondary | ICD-10-CM | POA: Diagnosis not present

## 2023-03-26 DIAGNOSIS — E88819 Insulin resistance, unspecified: Secondary | ICD-10-CM

## 2023-03-26 DIAGNOSIS — E669 Obesity, unspecified: Secondary | ICD-10-CM

## 2023-03-26 DIAGNOSIS — E559 Vitamin D deficiency, unspecified: Secondary | ICD-10-CM

## 2023-03-26 MED ORDER — VITAMIN D (ERGOCALCIFEROL) 1.25 MG (50000 UNIT) PO CAPS: 50000.0000 [IU] | ORAL_CAPSULE | ORAL | 0 refills | Status: DC

## 2023-03-26 MED ORDER — SEMAGLUTIDE-WEIGHT MANAGEMENT 1 MG/0.5ML ~~LOC~~ SOAJ: 1.0000 mg | SUBCUTANEOUS | 0 refills | Status: DC

## 2023-03-26 NOTE — Progress Notes (Addendum)
 WEIGHT SUMMARY AND BIOMETRICS  Vitals Temp: (!) 97.5 F (36.4 C) BP: (!) 198/68 Pulse Rate: 69 SpO2: 94 %   Anthropometric Measurements Height: 6' 2 (1.88 m) Weight: (!) 330 lb (149.7 kg) BMI (Calculated): 42.35 Weight at Last Visit: 329lb Weight Lost Since Last Visit: 0 Weight Gained Since Last Visit: 1lb Starting Weight: 336lb Total Weight Loss (lbs): 6 lb (2.722 kg)   Body Composition  Body Fat %: 39 % Fat Mass (lbs): 128.8 lbs Muscle Mass (lbs): 191.6 lbs Total Body Water (lbs): 140.8 lbs Visceral Fat Rating : 24   Other Clinical Data Fasting: no Labs: no Today's Visit #: 7 Starting Date: 07/30/22    Chief Complaint:   OBESITY Edward Johnson is here to discuss his progress with his obesity treatment plan. He is on the keeping a food journal and adhering to recommended goals of 1500 calories and 95+ protein and states he is following his eating plan approximately 70 % of the time. He states he is exercising: None   Interim History:  Edward Johnson reports several falls the last several months. All instances- he was walking without his cane and L knee gave out.  He fell to the ground and caught himself with outstretched L arm. Subsequently, he has de Quervain's tenosynovitis by He is received steroid injection in L wrist earlier this week.  Subjective:   1. Fall, subsequent encounter 03/24/2023 Ortho OV Notes HPI 03/24/2023: History of Present Illness 53 year old male returns for follow-up of left wrist pain. Previously diagnosed with de Quervain's tenosynovitis by Dr. Donnice Robinsons and received a steroid injection over a year ago. Approximately 3-4 months ago, he fell and braced with his left hand. Initially, no discomfort, but pain progressively worsened. Pain is severe, burning, localized to the radial aspect of the wrist and base of the thumb, extending into the dorsal hand. Reports sensation of swelling in the hand and fingers, no numbness or tingling. Feels  tightness in fingers and struggles to make a fist. He denies history of diabetes.  Left upper Extremity / Hand Physical Exam Left wrist and hand show partial amputation of the left small finger at the DIP joint. No deformities or wounds. Slight swelling along the wrist and dorsal hand. Exquisitely tender over the first dorsal compartment, anatomic snuffbox, and scaphoid tubercle. Tenderness over SL and slight over LT interval. Nontender over ECU, fovea, and pisiform. Can make a composite fist. Positive Terrilee and Eichhoff tests. Significant pain with resisted thumb extension and radial deviated wrist position. Pain with stress opposition test of thumb metacarpal, no pain with stress retropulsion test. Negative grind test. Tenderness around radial wrist with boggy swelling extending 5 cm proximal to forearm. Assessment & Plan 1. De Quervain's tenosynovitis - Clinical findings align with severe de Quervain's tenosynovitis - MRI deferred - Steroid injection administered today - Discussed risks of injection (infection, nerve damage, skin depigmentation, temporary pain) - Patient prefers injection over MRI - Patient consents to the injection Follow-up in 6 to 8 weeks   2. Pure hypercholesterolemia He is on Crestor  5mg  daily He denies myalgias He is not currently exercising Lipid Panel     Component Value Date/Time   CHOL 240 (H) 07/30/2022 0932   TRIG 116 07/30/2022 0932   HDL 66 07/30/2022 0932   CHOLHDL 4.6 01/29/2011 0602   VLDL 21 01/29/2011 0602   LDLCALC 154 (H) 07/30/2022 0932   LABVLDL 20 07/30/2022 0932   The 10-year ASCVD risk score (Arnett DK, et al.,  2019) is: 19.4%   Values used to calculate the score:     Age: 53 years     Sex: Male     Is Non-Hispanic African American: Yes     Diabetic: No     Tobacco smoker: No     Systolic Blood Pressure: 198 mmHg     Is BP treated: Yes     HDL Cholesterol: 66 mg/dL     Total Cholesterol: 240 mg/dL   3. Vitamin D  deficiency   Latest Reference Range & Units 07/30/22 09:32  Vitamin D , 25-Hydroxy 30.0 - 100.0 ng/mL 19.0 (L)  (L): Data is abnormally low  He is on weekly Ergocalciferol - he denies N/V/Muscle Weakness  4. Insulin  resistance  Latest Reference Range & Units 07/30/22 09:32  Glucose 70 - 99 mg/dL 89  Hemoglobin J8R 4.8 - 5.6 % 5.3  Est. average glucose Bld gHb Est-mCnc mg/dL 894  INSULIN  2.6 - 24.9 uIU/mL 23.5   Started on Wegovy  0.25mg  Late Fall 2024 Increased Wegovy  0.25mg  to 0.5mg  on/about 01/29/2023 Denies mass in neck, dysphagia, dyspepsia, persistent hoarseness, abdominal pain, or N/V/C  GERD sx's stable  Assessment/Plan:   1. Fall, subsequent encounter F/u with Orthopedic Specialist as directed  2. Pure hypercholesterolemia Check Labs - Comprehensive metabolic panel - Lipid panel  Statin adjustment if levels still above goal  3. Vitamin D  deficiency Check Labs and Refill - Vitamin D , Ergocalciferol , (DRISDOL ) 1.25 MG (50000 UNIT) CAPS capsule; Take 1 capsule (50,000 Units total) by mouth every 7 (seven) days.  Dispense: 4 capsule; Refill: 0 - VITAMIN D  25 Hydroxy (Vit-D Deficiency, Fractures)  4. Insulin  resistance (Primary) Check Labs - Hemoglobin A1c - Insulin , random  5. BMI 39.0-39.9,adult, Current BMI 42.35 Refill and INCREASE Inject 1 mg into the skin once a week. Dispense: 2 mL, Refills: 0 ordered   Edward Johnson is currently in the action stage of change. As such, his goal is to continue with weight loss efforts. He has agreed to the Category 3 Plan and keeping a food journal and adhering to recommended goals of 1500 calories and 95+ protein.   Exercise goals: No exercise has been prescribed at this time.  Behavioral modification strategies: increasing lean protein intake, decreasing simple carbohydrates, increasing vegetables, increasing water intake, no skipping meals, meal planning and cooking strategies, keeping healthy foods in the home, ways to avoid boredom eating,  better snacking choices, and planning for success.  Edward Johnson has agreed to follow-up with our clinic in 4 weeks. He was informed of the importance of frequent follow-up visits to maximize his success with intensive lifestyle modifications for his multiple health conditions.   Edward Johnson was informed we would discuss his lab results at his next visit unless there is a critical issue that needs to be addressed sooner. Edward Johnson agreed to keep his next visit at the agreed upon time to discuss these results.  Objective:   Blood pressure (!) 198/68, pulse 69, temperature (!) 97.5 F (36.4 C), height 6' 2 (1.88 m), weight (!) 330 lb (149.7 kg), SpO2 94%. Body mass index is 42.37 kg/m.  General: Cooperative, alert, well developed, in no acute distress. HEENT: Conjunctivae and lids unremarkable. Cardiovascular: Regular rhythm.  Lungs: Normal work of breathing. Neurologic: No focal deficits.   Lab Results  Component Value Date   CREATININE 0.92 07/30/2022   BUN 15 07/30/2022   NA 141 07/30/2022   K 4.4 07/30/2022   CL 103 07/30/2022   CO2 23 07/30/2022   Lab Results  Component  Value Date   ALT 25 07/30/2022   AST 25 07/30/2022   ALKPHOS 67 07/30/2022   BILITOT 0.5 07/30/2022   Lab Results  Component Value Date   HGBA1C 5.3 07/30/2022   HGBA1C 5.6 07/28/2013   HGBA1C 5.5 01/28/2011   Lab Results  Component Value Date   INSULIN  23.5 07/30/2022   Lab Results  Component Value Date   TSH 2.880 07/27/2013   Lab Results  Component Value Date   CHOL 240 (H) 07/30/2022   HDL 66 07/30/2022   LDLCALC 154 (H) 07/30/2022   TRIG 116 07/30/2022   CHOLHDL 4.6 01/29/2011   Lab Results  Component Value Date   VD25OH 19.0 (L) 07/30/2022   Lab Results  Component Value Date   WBC 6.9 02/17/2022   HGB 12.5 (L) 02/17/2022   HCT 37.2 (L) 02/17/2022   MCV 95 02/17/2022   PLT 319 02/17/2022   No results found for: IRON, TIBC, FERRITIN  Isabella was educated on the importance of frequent  visits to treat obesity as outlined per CMS and USPSTF guidelines and agreed to schedule his next follow up appointment today.  Attestation Statements:   Reviewed by clinician on day of visit: allergies, medications, problem list, medical history, surgical history, family history, social history, and previous encounter notes.  I have reviewed the above documentation for accuracy and completeness, and I agree with the above. -  Edward Johnson d. Breon Diss, NP-C

## 2023-03-28 LAB — COMPREHENSIVE METABOLIC PANEL
ALT: 23 [IU]/L (ref 0–44)
AST: 25 [IU]/L (ref 0–40)
Albumin: 4.5 g/dL (ref 3.8–4.9)
Alkaline Phosphatase: 64 [IU]/L (ref 44–121)
BUN/Creatinine Ratio: 15 (ref 9–20)
BUN: 14 mg/dL (ref 6–24)
Bilirubin Total: 0.5 mg/dL (ref 0.0–1.2)
CO2: 22 mmol/L (ref 20–29)
Calcium: 9.3 mg/dL (ref 8.7–10.2)
Chloride: 101 mmol/L (ref 96–106)
Creatinine, Ser: 0.93 mg/dL (ref 0.76–1.27)
Globulin, Total: 3.2 g/dL (ref 1.5–4.5)
Glucose: 70 mg/dL (ref 70–99)
Potassium: 4.1 mmol/L (ref 3.5–5.2)
Sodium: 140 mmol/L (ref 134–144)
Total Protein: 7.7 g/dL (ref 6.0–8.5)
eGFR: 99 mL/min/{1.73_m2} (ref >59)

## 2023-03-28 LAB — HEMOGLOBIN A1C
Est. average glucose Bld gHb Est-mCnc: 100 mg/dL
Hgb A1c MFr Bld: 5.1 % (ref 4.8–5.6)

## 2023-03-28 LAB — INSULIN, RANDOM: INSULIN: 35.1 u[IU]/mL — ABNORMAL HIGH (ref 2.6–24.9)

## 2023-03-28 LAB — VITAMIN D 25 HYDROXY (VIT D DEFICIENCY, FRACTURES): Vit D, 25-Hydroxy: 49.1 ng/mL (ref 30.0–100.0)

## 2023-03-28 LAB — LIPID PANEL
Chol/HDL Ratio: 3.2 {ratio} (ref 0.0–5.0)
Cholesterol, Total: 188 mg/dL (ref 100–199)
HDL: 59 mg/dL (ref >39)
LDL Chol Calc (NIH): 114 mg/dL — ABNORMAL HIGH (ref 0–99)
Triglycerides: 80 mg/dL (ref 0–149)
VLDL Cholesterol Cal: 15 mg/dL (ref 5–40)

## 2023-04-07 ENCOUNTER — Other Ambulatory Visit (INDEPENDENT_AMBULATORY_CARE_PROVIDER_SITE_OTHER): Payer: Self-pay | Admitting: Adult Health

## 2023-04-07 DIAGNOSIS — E559 Vitamin D deficiency, unspecified: Secondary | ICD-10-CM

## 2023-04-19 ENCOUNTER — Other Ambulatory Visit (INDEPENDENT_AMBULATORY_CARE_PROVIDER_SITE_OTHER): Payer: Self-pay | Admitting: Adult Health

## 2023-04-30 ENCOUNTER — Ambulatory Visit (INDEPENDENT_AMBULATORY_CARE_PROVIDER_SITE_OTHER): Payer: Medicaid Other | Admitting: Adult Health

## 2023-05-11 ENCOUNTER — Other Ambulatory Visit (INDEPENDENT_AMBULATORY_CARE_PROVIDER_SITE_OTHER): Payer: Self-pay | Admitting: Adult Health

## 2023-05-11 DIAGNOSIS — E559 Vitamin D deficiency, unspecified: Secondary | ICD-10-CM

## 2023-05-12 ENCOUNTER — Ambulatory Visit (INDEPENDENT_AMBULATORY_CARE_PROVIDER_SITE_OTHER): Payer: Medicaid Other | Admitting: Adult Health

## 2023-05-12 ENCOUNTER — Encounter (INDEPENDENT_AMBULATORY_CARE_PROVIDER_SITE_OTHER): Payer: Self-pay | Admitting: Adult Health

## 2023-05-12 VITALS — BP 160/85 | HR 71 | Temp 97.8°F | Ht 74.0 in | Wt 335.0 lb

## 2023-05-12 DIAGNOSIS — E559 Vitamin D deficiency, unspecified: Secondary | ICD-10-CM

## 2023-05-12 DIAGNOSIS — I10 Essential (primary) hypertension: Secondary | ICD-10-CM

## 2023-05-12 DIAGNOSIS — E7800 Pure hypercholesterolemia, unspecified: Secondary | ICD-10-CM

## 2023-05-12 DIAGNOSIS — Z6841 Body Mass Index (BMI) 40.0 and over, adult: Secondary | ICD-10-CM

## 2023-05-12 DIAGNOSIS — E88819 Insulin resistance, unspecified: Secondary | ICD-10-CM

## 2023-05-12 DIAGNOSIS — Z6839 Body mass index (BMI) 39.0-39.9, adult: Secondary | ICD-10-CM

## 2023-05-12 MED ORDER — VITAMIN D (ERGOCALCIFEROL) 1.25 MG (50000 UNIT) PO CAPS: 50000.0000 [IU] | ORAL_CAPSULE | ORAL | 0 refills | Status: DC

## 2023-05-12 MED ORDER — ROSUVASTATIN CALCIUM 5 MG PO TABS: 5.0000 mg | ORAL_TABLET | Freq: Every day | ORAL | 0 refills | Status: DC

## 2023-05-12 MED ORDER — WEGOVY 1.7 MG/0.75ML ~~LOC~~ SOAJ: 1.7000 mg | SUBCUTANEOUS | 0 refills | Status: DC

## 2023-05-12 NOTE — Progress Notes (Signed)
 WEIGHT SUMMARY AND BIOMETRICS  Vitals Temp: 97.8 F (36.6 C) BP: (!) 160/85 Pulse Rate: 71 SpO2: 98 %   Anthropometric Measurements Height: 6\' 2"  (1.88 m) Weight: (!) 335 lb (152 kg) BMI (Calculated): 42.99 Weight at Last Visit: 330 lb Weight Lost Since Last Visit: 0 Weight Gained Since Last Visit: 5 lb Starting Weight: 336 lb Total Weight Loss (lbs): 1 lb (0.454 kg)   Body Composition  Body Fat %: 39.8 % Fat Mass (lbs): 133.4 lbs Muscle Mass (lbs): 192 lbs Total Body Water (lbs): 144.8 lbs Visceral Fat Rating : 25   Other Clinical Data Fasting: No Labs: No Today's Visit #: 8 Starting Date: 07/30/22    Chief Complaint:   OBESITY Edward Johnson is here to discuss his progress with his obesity treatment plan.  He is on the the Category 3 Plan and states he is following his eating plan approximately 80 % of the time.  He states he is exercising: NEAT Actitivites   Interim History:  Started on Wegovy 0.25mg  Late Fall 2024 Increased Wegovy 0.25mg  to 0.5mg  on/about 01/29/2023 Increased Wegovy 0.5mg  to 1 mg on/about 03/26/2023 He endorses increased appetite the last several weeks ???  He would like to increase daily activity. He continues to ambulate with a cane.  Subjective:   1. Primary hypertension Discussed Labs BP well above goal He denies acute cardiac sx's He reports daily compliance with Amlodipine 10mg  and Lisinopril 20mg  - managed by PCP 03/26/2023: CMP- electrolytes, kidney/liver enzymes normal  2. Vitamin D deficiency Discussed Labs  Latest Reference Range & Units 07/30/22 09:32 03/26/23 12:59  Vitamin D, 25-Hydroxy 30.0 - 100.0 ng/mL 19.0 (L) 49.1  (L): Data is abnormally low  Vit D Level much improved and almost at goal 50-70 He is on weekly Ergocalciferol- denies N/V/Muscle Weakness  3. Insulin resistance Discussed Labs  Latest Reference Range & Units 03/26/23 12:59  Glucose 70 - 99 mg/dL 70  Hemoglobin W0J 4.8 - 5.6 % 5.1  Est. average  glucose Bld gHb Est-mCnc mg/dL 811  INSULIN 2.6 - 91.4 uIU/mL 35.1 (H)  (H): Data is abnormally high  CBG and A1c both at goal Insulin level worsened and well above goal of 5 He is in weekly [facility] 1mg  Denies mass in neck, dysphagia, dyspepsia, persistent hoarseness, abdominal pain, or N/V/C   4. Pure hypercholesterolemia Discussed Labs Lipid Panel     Component Value Date/Time   CHOL 188 03/26/2023 1259   TRIG 80 03/26/2023 1259   HDL 59 03/26/2023 1259   CHOLHDL 3.2 03/26/2023 1259   CHOLHDL 4.6 01/29/2011 0602   VLDL 21 01/29/2011 0602   LDLCALC 114 (H) 03/26/2023 1259   LABVLDL 15 03/26/2023 1259   The 10-year ASCVD risk score (Arnett DK, et al., 2019) is: 12.9%   Values used to calculate the score:     Age: 53 years     Sex: Male     Is Non-Hispanic African American: Yes     Diabetic: No     Tobacco smoker: No     Systolic Blood Pressure: 160 mmHg     Is BP treated: Yes     HDL Cholesterol: 59 mg/dL     Total Cholesterol: 188 mg/dL   He is on daily Crestor 5mg  He denies acute cardiac sx's He would prefer to remain on current dose and add in more exercise  Assessment/Plan:   1. Primary hypertension (Primary) Discussed Red Flag sx's and if develop- seek immediate medical assistance. He  verbalized understanding/agreement Record BP and bring log to f/u next week at PCP Limit Na+ Increase water intake Take all antihypertensives as directed  2. Vitamin D deficiency Refill - Vitamin D, Ergocalciferol, (DRISDOL) 1.25 MG (50000 UNIT) CAPS capsule; Take 1 capsule (50,000 Units total) by mouth every 7 (seven) days.  Dispense: 4 capsule; Refill: 0  3. Insulin resistance Limit sugar/simple CHO Increase regular exercise Increase GLP-1 therapy - can also help lower BP, Cholesterol, Blood Glucose, and Weight  4. Pure hypercholesterolemia Refill - rosuvastatin (CRESTOR) 5 MG tablet; Take 1 tablet (5 mg total) by mouth daily.  Dispense: 90 tablet; Refill: 0  If ASCVD  still above goal at next check- increase statin therapy  5. BMI 40.0-44.9, adult (HCC), Current BMI 42.99 Refill and INCREASE  Semaglutide-Weight Management (WEGOVY) 1.7 MG/0.75ML SOAJ Inject 1.7 mg into the skin once a week. Dispense: 3 mL, Refills: 0 ordered   Edward Johnson is currently in the action stage of change. As such, his goal is to continue with weight loss efforts. He has agreed to the Category 3 Plan.   Exercise goals: All adults should avoid inactivity. Some physical activity is better than none, and adults who participate in any amount of physical activity gain some health benefits. Adults should also include muscle-strengthening activities that involve all major muscle groups on 2 or more days a week.  Behavioral modification strategies: increasing lean protein intake, decreasing simple carbohydrates, increasing vegetables, increasing water intake, no skipping meals, meal planning and cooking strategies, keeping healthy foods in the home, and planning for success.  Edward Johnson has agreed to follow-up with our clinic in 4 weeks. He was informed of the importance of frequent follow-up visits to maximize his success with intensive lifestyle modifications for his multiple health conditions.   Objective:   Blood pressure (!) 160/85, pulse 71, temperature 97.8 F (36.6 C), height 6\' 2"  (1.88 m), weight (!) 335 lb (152 kg), SpO2 98%. Body mass index is 43.01 kg/m.  General: Cooperative, alert, well developed, in no acute distress. HEENT: Conjunctivae and lids unremarkable. Cardiovascular: Regular rhythm.  Lungs: Normal work of breathing. Neurologic: No focal deficits.   Lab Results  Component Value Date   CREATININE 0.93 03/26/2023   BUN 14 03/26/2023   NA 140 03/26/2023   K 4.1 03/26/2023   CL 101 03/26/2023   CO2 22 03/26/2023   Lab Results  Component Value Date   ALT 23 03/26/2023   AST 25 03/26/2023   ALKPHOS 64 03/26/2023   BILITOT 0.5 03/26/2023   Lab Results  Component  Value Date   HGBA1C 5.1 03/26/2023   HGBA1C 5.3 07/30/2022   HGBA1C 5.6 07/28/2013   HGBA1C 5.5 01/28/2011   Lab Results  Component Value Date   INSULIN 35.1 (H) 03/26/2023   INSULIN 23.5 07/30/2022   Lab Results  Component Value Date   TSH 2.880 07/27/2013   Lab Results  Component Value Date   CHOL 188 03/26/2023   HDL 59 03/26/2023   LDLCALC 114 (H) 03/26/2023   TRIG 80 03/26/2023   CHOLHDL 3.2 03/26/2023   Lab Results  Component Value Date   VD25OH 49.1 03/26/2023   VD25OH 19.0 (L) 07/30/2022   Lab Results  Component Value Date   WBC 6.9 02/17/2022   HGB 12.5 (L) 02/17/2022   HCT 37.2 (L) 02/17/2022   MCV 95 02/17/2022   PLT 319 02/17/2022   No results found for: "IRON", "TIBC", "FERRITIN"  Attestation Statements:   Reviewed by clinician on day  of visit: allergies, medications, problem list, medical history, surgical history, family history, social history, and previous encounter notes.  I have reviewed the above documentation for accuracy and completeness, and I agree with the above. -  Ludwika Rodd d. Natash Berman, NP-C

## 2023-05-13 ENCOUNTER — Telehealth (INDEPENDENT_AMBULATORY_CARE_PROVIDER_SITE_OTHER): Payer: Self-pay

## 2023-05-13 NOTE — Telephone Encounter (Signed)
 PA for [facility] 1.7 has been submitted, awaiting PA questions.

## 2023-05-18 ENCOUNTER — Encounter (INDEPENDENT_AMBULATORY_CARE_PROVIDER_SITE_OTHER): Payer: Self-pay

## 2023-05-18 NOTE — Telephone Encounter (Signed)
 Sent the patient a message and asked a question about the [facility], awaiting his reply.

## 2023-05-19 NOTE — Telephone Encounter (Signed)
 PA for [facility] 1.7 has been denied. PA is now complete.

## 2023-06-10 ENCOUNTER — Ambulatory Visit (INDEPENDENT_AMBULATORY_CARE_PROVIDER_SITE_OTHER): Payer: Medicaid Other | Admitting: Adult Health

## 2023-07-02 ENCOUNTER — Ambulatory Visit (INDEPENDENT_AMBULATORY_CARE_PROVIDER_SITE_OTHER): Admitting: Adult Health

## 2023-07-02 ENCOUNTER — Encounter (INDEPENDENT_AMBULATORY_CARE_PROVIDER_SITE_OTHER): Payer: Self-pay | Admitting: Adult Health

## 2023-07-02 VITALS — BP 146/77 | HR 74 | Temp 98.1°F | Ht 77.0 in | Wt 323.0 lb

## 2023-07-02 DIAGNOSIS — E88819 Insulin resistance, unspecified: Secondary | ICD-10-CM

## 2023-07-02 DIAGNOSIS — Z6841 Body Mass Index (BMI) 40.0 and over, adult: Secondary | ICD-10-CM

## 2023-07-02 DIAGNOSIS — E559 Vitamin D deficiency, unspecified: Secondary | ICD-10-CM

## 2023-07-02 DIAGNOSIS — I10 Essential (primary) hypertension: Secondary | ICD-10-CM

## 2023-07-02 DIAGNOSIS — E669 Obesity, unspecified: Secondary | ICD-10-CM

## 2023-07-02 MED ORDER — WEGOVY 2.4 MG/0.75ML ~~LOC~~ SOAJ: 2.4000 mg | SUBCUTANEOUS | 0 refills | Status: DC

## 2023-07-02 MED ORDER — VITAMIN D (ERGOCALCIFEROL) 1.25 MG (50000 UNIT) PO CAPS: 50000.0000 [IU] | ORAL_CAPSULE | ORAL | 0 refills | Status: DC

## 2023-07-02 NOTE — Progress Notes (Signed)
 WEIGHT SUMMARY AND BIOMETRICS  Vitals Temp: 98.1 F (36.7 C) BP: (!) 146/77 Pulse Rate: 74 SpO2: 97 %   Anthropometric Measurements Height: 6\' 2"  (1.88 m) Weight at Last Visit: 335 lb Starting Weight: 336 lb   No data recorded Other Clinical Data Fasting: no Labs: no Today's Visit #: 9 Starting Date: 07/30/22    Chief Complaint:   OBESITY Edward Johnson is here to discuss his progress with his obesity treatment plan. He is on the the Category 3 Plan and keeping a food journal and adhering to recommended goals of 1500 calories and 100g+ protein and states he is following his eating plan approximately 85-90 % of the time.  He states he is exercising Walking 30 minutes 6 times per week.  Interim History:  Started on Wegovy 0.25mg  Late Fall 2024 Increased Wegovy 0.25mg  to 0.5mg  on/about 01/29/2023 Increased Wegovy 0.5mg  to 1 mg on/about 03/26/2023 Increased Wegovy 1mg  to 1.7mg  on/about 05/12/2023 Denies mass in neck, dysphagia, dyspepsia, persistent hoarseness, abdominal pain, or N/V/C   Reviewed Bioimpedance Results with pt: Muscle Mass: +1.3 lbs Adipose Mass: -13  lbs  Exercise-walking and he wants to add in strength training in the next few months  Subjective:   1. Primary hypertension BP improved, however still above goal He has lost 25 lbs and has increased his walking distance He denies CP with exertion He is on  rivaroxaban (XARELTO) 20 MG TABS tablet  lisinopril (ZESTRIL) 20 MG tablet  rosuvastatin (CRESTOR) 5 MG tablet  atorvastatin (LIPITOR) 10 MG tablet  lisinopril-hydrochlorothiazide (ZESTORETIC) 20-25 MG tablet   2. Vitamin D deficiency  Latest Reference Range & Units 07/30/22 09:32 03/26/23 12:59  Vitamin D, 25-Hydroxy 30.0 - 100.0 ng/mL 19.0 (L) 49.1  (L): Data is abnormally low  He endorses stable energy levels He has increased walking duration He is on weekly Ergocalciferol- denies N/V/Muscle Weakness  3. Insulin resistance  Latest  Reference Range & Units 07/30/22 09:32 03/26/23 12:59  INSULIN 2.6 - 24.9 uIU/mL 23.5 35.1 (H)  (H): Data is abnormally high Started on Wegovy 0.25mg  Late Fall 2024 Increased Wegovy 0.25mg  to 0.5mg  on/about 01/29/2023 Increased Wegovy 0.5mg  to 1 mg on/about 03/26/2023 Increased Wegovy 1mg  to 1.7mg  on/about 05/12/2023 Denies mass in neck, dysphagia, dyspepsia, persistent hoarseness, abdominal pain, or N/V/C   Assessment/Plan:   1. Primary hypertension (Primary) Limit Na+ Continue regular walking Continue  rivaroxaban (XARELTO) 20 MG TABS tablet  lisinopril (ZESTRIL) 20 MG tablet  rosuvastatin (CRESTOR) 5 MG tablet  atorvastatin (LIPITOR) 10 MG tablet  lisinopril-hydrochlorothiazide (ZESTORETIC) 20-25 MG tablet   2. Vitamin D deficiency Refill  Vitamin D, Ergocalciferol, (DRISDOL) 1.25 MG (50000 UNIT) CAPS capsule Take 1 capsule (50,000 Units total) by mouth every 7 (seven) days. Dispense: 4 capsule, Refills: 0 ordered   3. Insulin resistance Continue regular walking and following Cat 3 MP Continue weekly GLP-1 therapy  4. BMI 40.0-44.9, adult (HCC), Current BMI 42.99 Refill and INCREASE Semaglutide-Weight Management (WEGOVY) 2.4 MG/0.75ML SOAJ Inject 2.4 mg into the skin once a week. Dispense: 3 mL, Refills: 0 ordered   Orlyn is currently in the action stage of change. As such, his goal is to continue with weight loss efforts. He has agreed to the Category 3 Plan.   Exercise goals: All adults should avoid inactivity. Some physical activity is better than none, and adults who participate in any amount of physical activity gain some health benefits. Adults should also include muscle-strengthening activities that involve all major muscle groups on 2  or more days a week.  Behavioral modification strategies: increasing lean protein intake, decreasing simple carbohydrates, increasing vegetables, increasing water intake, no skipping meals, meal planning and cooking strategies, keeping  healthy foods in the home, ways to avoid boredom eating, and planning for success.  Kayshawn has agreed to follow-up with our clinic in 4 weeks. He was informed of the importance of frequent follow-up visits to maximize his success with intensive lifestyle modifications for his multiple health conditions.   Check Fasting Labs at next OV  Objective:   Blood pressure (!) 146/77, pulse 74, temperature 98.1 F (36.7 C), height 6\' 2"  (1.88 m), SpO2 97%. Body mass index is 43.01 kg/m.  General: Cooperative, alert, well developed, in no acute distress. HEENT: Conjunctivae and lids unremarkable. Cardiovascular: Regular rhythm.  Lungs: Normal work of breathing. Neurologic: No focal deficits.   Lab Results  Component Value Date   CREATININE 0.93 03/26/2023   BUN 14 03/26/2023   NA 140 03/26/2023   K 4.1 03/26/2023   CL 101 03/26/2023   CO2 22 03/26/2023   Lab Results  Component Value Date   ALT 23 03/26/2023   AST 25 03/26/2023   ALKPHOS 64 03/26/2023   BILITOT 0.5 03/26/2023   Lab Results  Component Value Date   HGBA1C 5.1 03/26/2023   HGBA1C 5.3 07/30/2022   HGBA1C 5.6 07/28/2013   HGBA1C 5.5 01/28/2011   Lab Results  Component Value Date   INSULIN 35.1 (H) 03/26/2023   INSULIN 23.5 07/30/2022   Lab Results  Component Value Date   TSH 2.880 07/27/2013   Lab Results  Component Value Date   CHOL 188 03/26/2023   HDL 59 03/26/2023   LDLCALC 114 (H) 03/26/2023   TRIG 80 03/26/2023   CHOLHDL 3.2 03/26/2023   Lab Results  Component Value Date   VD25OH 49.1 03/26/2023   VD25OH 19.0 (L) 07/30/2022   Lab Results  Component Value Date   WBC 6.9 02/17/2022   HGB 12.5 (L) 02/17/2022   HCT 37.2 (L) 02/17/2022   MCV 95 02/17/2022   PLT 319 02/17/2022   No results found for: "IRON", "TIBC", "FERRITIN"  Attestation Statements:   Reviewed by clinician on day of visit: allergies, medications, problem list, medical history, surgical history, family history, social  history, and previous encounter notes.  I have reviewed the above documentation for accuracy and completeness, and I agree with the above. -  Derric Dealmeida d. Jeree Delcid, NP-C

## 2023-07-06 ENCOUNTER — Telehealth (INDEPENDENT_AMBULATORY_CARE_PROVIDER_SITE_OTHER): Payer: Self-pay | Admitting: *Deleted

## 2023-07-06 ENCOUNTER — Other Ambulatory Visit (INDEPENDENT_AMBULATORY_CARE_PROVIDER_SITE_OTHER): Payer: Self-pay | Admitting: Adult Health

## 2023-07-06 NOTE — Telephone Encounter (Signed)
 A prior authorization has been submitted for (Wegovy -2.4 mg/0.75Ml),waiting for their response.

## 2023-07-07 NOTE — Telephone Encounter (Signed)
 PA RESUBMMITTIED VIA COVERMYMEDS  Sharman Debar (Key: [id number])  Your information has been sent to [facility] [place]  Medicaid.

## 2023-07-08 ENCOUNTER — Other Ambulatory Visit (INDEPENDENT_AMBULATORY_CARE_PROVIDER_SITE_OTHER): Payer: Self-pay | Admitting: Adult Health

## 2023-07-09 ENCOUNTER — Telehealth (INDEPENDENT_AMBULATORY_CARE_PROVIDER_SITE_OTHER): Payer: Self-pay | Admitting: Adult Health

## 2023-07-09 NOTE — Telephone Encounter (Signed)
 Patient called in stating he spoke to the pharmacy and he was informed that the Wegovy  dosage needs to be decreased. He stated it should either be 1.3 or 1.7. Please follow up with the patient.

## 2023-07-10 ENCOUNTER — Other Ambulatory Visit (INDEPENDENT_AMBULATORY_CARE_PROVIDER_SITE_OTHER): Payer: Self-pay | Admitting: Adult Health

## 2023-07-10 MED ORDER — WEGOVY 2.4 MG/0.75ML ~~LOC~~ SOAJ: 2.4000 mg | SUBCUTANEOUS | 0 refills | Status: DC

## 2023-07-13 ENCOUNTER — Telehealth (INDEPENDENT_AMBULATORY_CARE_PROVIDER_SITE_OTHER): Payer: Self-pay | Admitting: *Deleted

## 2023-07-13 NOTE — Telephone Encounter (Signed)
 Called the patient to give information concerning medication denial and that it was denied because he had not lost 5% of his weight.No answer, left voice message to up date.  He may discuss other options at next appointment w/ Acie Holiday. Sent a [employer] as well.

## 2023-07-15 ENCOUNTER — Telehealth: Payer: Self-pay

## 2023-07-15 NOTE — Telephone Encounter (Signed)
   Name: Edward Johnson  DOB: May 01, 1970  MRN: [id number]  Primary Cardiologist: Oneil Bigness, MD  Chart reviewed as part of pre-operative protocol coverage. Because of Peng Oren's past medical history and time since last visit, he will require a follow-up in-office visit in order to better assess preoperative cardiovascular risk. Last OV 2023.  Pre-op covering staff: - Please schedule appointment and call patient to inform them. If patient already had an upcoming appointment within acceptable timeframe, please add "pre-op clearance" to the appointment notes so provider is aware. - Please contact requesting surgeon's office via preferred method (i.e, phone, fax) to inform them of need for appointment prior to surgery.  This message will also be routed to pharmacy pool on holding Xarelto  is requested below so that this information is available to the clearing provider at time of patient's appointment - however, we will need to clarify at OV whether he is taking as this was last prescribed by our team in 2016.  Mathhew Buysse N Westyn Driggers, PA-C  07/15/2023, 3:56 PM

## 2023-07-15 NOTE — Telephone Encounter (Signed)
   Pre-operative Risk Assessment    Patient Name: Edward Johnson  DOB: 1970/06/21 MRN: [id number]   Date of last office visit: 02/17/22 Morey Ar, NP Date of next office visit: NONE   Request for Surgical Clearance    Procedure:   LEFT 1ST DORSAL COMPARTMENT RELEASE  Date of Surgery:  Clearance 07/28/23                                Surgeon:  Ellene Gustin, MD Surgeon's Group or Practice Name:  [facility]  Phone number:  [phone] Fax number:  [phone]   Type of Clearance Requested:   - Medical  - Pharmacy:  Hold Rivaroxaban  (Xarelto ) 3 DAYS PRIOR ( IF LOVENOX  BRIDGE IS REQUIRED, WE REQUEST IT BE ARRANGED BY THE PRESCRIBING PROVIDER)   Type of Anesthesia:  Not Indicated   Additional requests/questions:    Signed, Collin Deal   07/15/2023, 3:41 PM

## 2023-07-15 NOTE — Telephone Encounter (Signed)
Left message to call back to schedule in office appt for pre op clearance.

## 2023-07-16 NOTE — Telephone Encounter (Signed)
 Patient has been scheduled for pre op clearance.

## 2023-07-16 NOTE — Telephone Encounter (Signed)
I left a message for the patient to call our office to schedule a tele visit for pre-op 

## 2023-07-20 NOTE — Telephone Encounter (Signed)
 Patient with diagnosis of afib on Xarelto  for anticoagulation.    Procedure: LEFT 1ST DORSAL COMPARTMENT RELEASE  Date of procedure: 07/28/23   CHA2DS2-VASc Score = 1   This indicates a 0.6% annual risk of stroke. The patient's score is based upon: CHF History: 0 HTN History: 1 Diabetes History: 0 Stroke History: 0 Vascular Disease History: 0 Age Score: 0 Gender Score: 0      CrCl >100 ml/min Platelet count not available, will not change clinical decision  Patient has not had an Afib/aflutter ablation within the last 3 months or DCCV within the last 30 days  Per office protocol, patient can hold Xarelto  for 3 days prior to procedure.   Patient will NOT need bridging with Lovenox  (enoxaparin ) around procedure.  **This guidance is not considered finalized until pre-operative APP has relayed final recommendations.**

## 2023-07-24 ENCOUNTER — Ambulatory Visit (HOSPITAL_BASED_OUTPATIENT_CLINIC_OR_DEPARTMENT_OTHER): Admitting: Family

## 2023-07-24 ENCOUNTER — Encounter (HOSPITAL_BASED_OUTPATIENT_CLINIC_OR_DEPARTMENT_OTHER): Payer: Self-pay | Admitting: Family

## 2023-07-24 VITALS — BP 128/78 | HR 94 | Ht 77.0 in | Wt 331.6 lb

## 2023-07-24 DIAGNOSIS — Z01810 Encounter for preprocedural cardiovascular examination: Secondary | ICD-10-CM

## 2023-07-24 DIAGNOSIS — I480 Paroxysmal atrial fibrillation: Secondary | ICD-10-CM | POA: Diagnosis not present

## 2023-07-24 DIAGNOSIS — D6859 Other primary thrombophilia: Secondary | ICD-10-CM | POA: Diagnosis not present

## 2023-07-24 DIAGNOSIS — E119 Type 2 diabetes mellitus without complications: Secondary | ICD-10-CM

## 2023-07-24 DIAGNOSIS — E782 Mixed hyperlipidemia: Secondary | ICD-10-CM

## 2023-07-24 DIAGNOSIS — I10 Essential (primary) hypertension: Secondary | ICD-10-CM

## 2023-07-24 MED ORDER — RIVAROXABAN 20 MG PO TABS
20.0000 mg | ORAL_TABLET | Freq: Every day | ORAL | 5 refills | Status: DC
Start: 2023-07-24 — End: 2023-07-24

## 2023-07-24 MED ORDER — RIVAROXABAN 20 MG PO TABS: 20.0000 mg | ORAL_TABLET | Freq: Every day | ORAL | 30 refills | Status: AC

## 2023-07-24 NOTE — Progress Notes (Addendum)
 Cardiology Office Note:  .   Date:  07/24/2023  ID:  Edward Johnson, DOB 1970/07/04, MRN [id number] PCP: Alfredia Ina, MD  [facility] Providers Cardiologist:  Oneil Bigness, MD    History of Present Illness: .   Edward Johnson is a 53 y.o. male with history of PAF s/p multiple cardioversions (2005, 2007, 2012, 2015) on  anticoagulation, hypertension, obesity, DM2, HLD.   Monitor 03/2019 predominantly normal sinus rhythm average heart rate of 65 bpm with no evidence of atrial fibrillation.  Echo 06/2019 normal LVEF suggested to 5%, moderate LVH, normal diastolic parameters, no significant valvular abnormalities.  Carotid duplex 04/2021 with no significant plaque bilaterally.  Presents today for preoperative clearance for left first dorsal compartment release scheduled 07/28/2023 with Dr. Ronita Cohens.  Per pharmacy team and office protocol may hold Xarelto  3 days prior to planned procedure. Feeling overall well. Reports no shortness of breath nor dyspnea on exertion. Reports no chest pain, pressure, or tightness. No edema, orthopnea, PND. Reports no palpitations.  Exercise limited by back pain and foot pain but able to achieve >4 METS. BP at home at goal <130/80 with readings such as 128/80. Discussed listed diagnosis of OSA in his chart, reports prior sleep study  many years ago. Intermittently snores with no daytime somnolence, PND.   ROS: Please see the history of present illness.    All other systems reviewed and are negative.   Studies Reviewed: Aaron Aas   EKG Interpretation Date/Time:  Friday Jul 24 2023 08:50:53 EDT Ventricular Rate:  67 PR Interval:  154 QRS Duration:  84 QT Interval:  402 QTC Calculation: 424 R Axis:   41  Text Interpretation: Normal sinus rhythm Normal ECG Confirmed by Neomi Banks ([id number]) on 07/24/2023 8:59:26 AM    Cardiac Studies & Procedures   ______________________________________________________________________________________________      ECHOCARDIOGRAM  ECHOCARDIOGRAM COMPLETE 06/20/2019  Narrative ECHOCARDIOGRAM REPORT    Patient Name:   Edward Johnson  Date of Exam: 06/20/2019 Medical Rec #:  [id number]     Height:       78.0 in Accession #:    [id number]    Weight:       365.0 lb Date of Birth:  Sep 30, 1970      BSA:          2.915 m Patient Age:    48 years      BP:           151/87 mmHg Patient Gender: M             HR:           62 bpm. Exam Location:  [address]  Procedure: 2D Echo, Cardiac Doppler and Color Doppler  Indications:    I48.0 Paroxysmal atrial fibrillation  History:        Patient has prior history of Echocardiogram examinations, most recent 04/28/2016. Risk Factors:Hypertension. Morbid obesity. Obstructive sleep apnea. History of noncompliance with medical treatment.  Sonographer:    Mylinda Asa RCS Referring Phys: [id number] HAO MENG  IMPRESSIONS   1. Left ventricular ejection fraction, by estimation, is 60 to 65%. The left ventricle has normal function. The left ventricle has no regional wall motion abnormalities. There is moderate concentric left ventricular hypertrophy. Left ventricular diastolic parameters were normal. 2. Right ventricular systolic function is normal. The right ventricular size is normal. 3. The mitral valve is normal in structure. No evidence of mitral valve regurgitation. No evidence of mitral stenosis. 4. The aortic valve is normal in  structure. Aortic valve regurgitation is not visualized. No aortic stenosis is present.  FINDINGS Left Ventricle: Left ventricular ejection fraction, by estimation, is 60 to 65%. The left ventricle has normal function. The left ventricle has no regional wall motion abnormalities. The left ventricular internal cavity size was normal in size. There is moderate concentric left ventricular hypertrophy. Left ventricular diastolic parameters were normal.  Right Ventricle: The right ventricular size is normal. No increase in right ventricular  wall thickness. Right ventricular systolic function is normal.  Left Atrium: Left atrial size was normal in size.  Right Atrium: Right atrial size was normal in size.  Pericardium: There is no evidence of pericardial effusion.  Mitral Valve: The mitral valve is normal in structure. No evidence of mitral valve regurgitation. No evidence of mitral valve stenosis.  Tricuspid Valve: The tricuspid valve is normal in structure. Tricuspid valve regurgitation is not demonstrated. No evidence of tricuspid stenosis.  Aortic Valve: The aortic valve is normal in structure. Aortic valve regurgitation is not visualized. No aortic stenosis is present.  Pulmonic Valve: The pulmonic valve was grossly normal. Pulmonic valve regurgitation is not visualized.  Aorta: The aortic root and ascending aorta are structurally normal, with no evidence of dilitation.  IAS/Shunts: The atrial septum is grossly normal.   LEFT VENTRICLE PLAX 2D LVIDd:         4.90 cm  Diastology LVIDs:         3.00 cm  LV e' lateral:   11.10 cm/s LV PW:         1.40 cm  LV E/e' lateral: 8.2 LV IVS:        1.50 cm  LV e' medial:    8.77 cm/s LVOT diam:     2.35 cm  LV E/e' medial:  10.3 LV SV:         116 LV SV Index:   40 LVOT Area:     4.34 cm   RIGHT VENTRICLE RV Basal diam:  2.40 cm RV S prime:     13.40 cm/s  LEFT ATRIUM             Index       RIGHT ATRIUM           Index LA diam:        4.30 cm 1.48 cm/m  RA Area:     17.50 cm LA Vol (A2C):   52.6 ml 18.04 ml/m RA Volume:   45.30 ml  15.54 ml/m LA Vol (A4C):   85.6 ml 29.36 ml/m LA Biplane Vol: 69.0 ml 23.67 ml/m AORTIC VALVE LVOT Vmax:   118.00 cm/s LVOT Vmean:  75.000 cm/s LVOT VTI:    0.267 m  AORTA Ao Root diam: 3.20 cm  MITRAL VALVE MV Area (PHT): 3.13 cm    SHUNTS MV Decel Time: 242 msec    Systemic VTI:  0.27 m MV E velocity: 90.57 cm/s  Systemic Diam: 2.35 cm MV A velocity: 94.00 cm/s MV E/A ratio:  0.96  Ahmad Alert MD Electronically  signed by Ahmad Alert MD Signature Date/Time: 06/20/2019/3:44:29 PM    Final    MONITORS  LONG TERM MONITOR (3-14 DAYS) 04/05/2019  Narrative Enrollment 02/23/2019-02/25/2019 (1 day 17 hours). Patient had a min HR of 49 bpm (sinus bradycardia), max HR of 115 bpm (sinus tachycardia), and avg HR of 65 bpm (normal sinus rhythm). Predominant underlying rhythm was Sinus Rhythm. No Isolated SVEs, SVE Couplets, or SVE Triplets were present. Isolated VEs were rare (<1.0%),  and no VE Couplets or VE Triplets were present. Diary symptoms reported outside the wear period. There were issues with patch attachment to the patient. No atrial fibrillation.  Impression: 1. No atrial fibrillation detected. 2. Rare ectopy.  [place] [id number] [facility] [address], [address] [place], [place] [address] [phone] 7:33 AM       ______________________________________________________________________________________________      Risk Assessment/Calculations:    CHA2DS2-VASc Score = 2   This indicates a 2.2% annual risk of stroke. The patient's score is based upon: CHF History: 0 HTN History: 1 Diabetes History: 1 Stroke History: 0 Vascular Disease History: 0 Age Score: 0 Gender Score: 0            Physical Exam:   VS:  BP 128/78 (BP Location: Left Arm, Patient Position: Sitting, Cuff Size: Large)   Pulse 94   Ht 6\' 5"  (1.956 m)   Wt (!) 331 lb 9.6 oz (150.4 kg)   SpO2 94%   BMI 39.32 kg/m    Wt Readings from Last 3 Encounters:  07/24/23 (!) 331 lb 9.6 oz (150.4 kg)  07/02/23 (!) 323 lb (146.5 kg)  05/12/23 (!) 335 lb (152 kg)    GEN: Well nourished, overweight, well developed in no acute distress NECK: No JVD; No carotid bruits CARDIAC: RRR, no murmurs, rubs, gallops RESPIRATORY:  Clear to auscultation without rales, wheezing or rhonchi  ABDOMEN: Soft, non-tender, non-distended EXTREMITIES:  No edema; No deformity   ASSESSMENT AND PLAN: .    Preop -  According to the Revised Cardiac Risk Index (RCRI), his Perioperative Risk of Major Cardiac Event is (%): 0.4. His Functional Capacity in METs is: 5.07 according to the Duke Activity Status Index (DASI). Per AHA/ACC guidelines, he is deemed acceptable risk for the planned procedure without additional cardiovascular testing. Will route to surgical team so they are aware.  Per pharmacy team and office protocols may hold Xarelto  3 days prior to planned procedure, he is aware of this recommendation. Does not require Lovenox  bridge.  OSA - prior diagnosis per medical review. Never use of CPAP. Reports intermittent snoring, no daytime somnolence. Will defer repeat testing as overall asymptomatic.   HLD - Continue Rosuvastatin  4mg  daily  DM2 - Continue to follow with PCP.   PAF / Hypercoagulable state - NSR by EKG today. No palpitations. CHA2DS2-VASc Score = 2 [CHF History: 0, HTN History: 1, Diabetes History: 1, Stroke History: 0, Vascular Disease History: 0, Age Score: 0, Gender Score: 0].  Therefore, the patient's annual risk of stroke is 2.2 %.    Continue Xarelto  20mg  daily. Refills provided. Denies bleeding complications. 03/2023 Hb 11.9, creatinine 0.93.          Dispo: follow up in 1 year  Signed, Clearnce Curia, NP

## 2023-07-24 NOTE — Patient Instructions (Addendum)
 Medication Instructions:  Your physician recommends that you continue on your current medications as directed. Please refer to the Current Medication list given to you today.   *If you need a refill on your cardiac medications before your next appointment, please call your pharmacy*  Lab Work: NONE  Testing/Procedures: NONE  Follow-Up: At [facility], you and your health needs are our priority.  As part of our continuing mission to provide you with exceptional heart care, our providers are all part of one team.  This team includes your primary Cardiologist (physician) and Advanced Practice Providers or APPs (Physician Assistants and Nurse Practitioners) who all work together to provide you with the care you need, when you need it.  Your next appointment:   12 month(s)  Provider:   Oneil Bigness, MD OR PA/NP    We recommend signing up for the patient portal called "MyChart".  Sign up information is provided on this After Visit Summary.  MyChart is used to connect with patients for Virtual Visits (Telemedicine).  Patients are able to view lab/test results, encounter notes, upcoming appointments, etc.  Non-urgent messages can be sent to your provider as well.   To learn more about what you can do with MyChart, go to [email or web address].   Other Instructions    Don't take your Xarelto  Saturday, Sunday, Monday, or Tuesday. Ask your surgeon when to resume after your surgery.

## 2023-08-06 ENCOUNTER — Ambulatory Visit (INDEPENDENT_AMBULATORY_CARE_PROVIDER_SITE_OTHER): Admitting: Adult Health

## 2023-09-16 ENCOUNTER — Other Ambulatory Visit (INDEPENDENT_AMBULATORY_CARE_PROVIDER_SITE_OTHER): Payer: Self-pay | Admitting: Adult Health

## 2023-09-16 DIAGNOSIS — E559 Vitamin D deficiency, unspecified: Secondary | ICD-10-CM

## 2023-09-24 ENCOUNTER — Encounter (INDEPENDENT_AMBULATORY_CARE_PROVIDER_SITE_OTHER): Payer: Self-pay | Admitting: Adult Health

## 2023-09-24 ENCOUNTER — Ambulatory Visit (INDEPENDENT_AMBULATORY_CARE_PROVIDER_SITE_OTHER): Admitting: Adult Health

## 2023-09-24 VITALS — BP 139/79 | HR 65 | Temp 98.6°F | Ht 77.0 in | Wt 333.0 lb

## 2023-09-24 DIAGNOSIS — Z6839 Body mass index (BMI) 39.0-39.9, adult: Secondary | ICD-10-CM

## 2023-09-24 DIAGNOSIS — I10 Essential (primary) hypertension: Secondary | ICD-10-CM | POA: Diagnosis not present

## 2023-09-24 DIAGNOSIS — E88819 Insulin resistance, unspecified: Secondary | ICD-10-CM

## 2023-09-24 DIAGNOSIS — E7800 Pure hypercholesterolemia, unspecified: Secondary | ICD-10-CM | POA: Diagnosis not present

## 2023-09-24 DIAGNOSIS — E669 Obesity, unspecified: Secondary | ICD-10-CM

## 2023-09-24 DIAGNOSIS — E559 Vitamin D deficiency, unspecified: Secondary | ICD-10-CM | POA: Diagnosis not present

## 2023-09-24 DIAGNOSIS — Z6841 Body Mass Index (BMI) 40.0 and over, adult: Secondary | ICD-10-CM

## 2023-09-24 MED ORDER — WEGOVY 0.25 MG/0.5ML ~~LOC~~ SOAJ: 0.2500 mg | SUBCUTANEOUS | 0 refills | Status: DC

## 2023-09-24 MED ORDER — VITAMIN D (ERGOCALCIFEROL) 1.25 MG (50000 UNIT) PO CAPS: 50000.0000 [IU] | ORAL_CAPSULE | ORAL | 0 refills | Status: DC

## 2023-09-24 MED ORDER — ROSUVASTATIN CALCIUM 5 MG PO TABS: 5.0000 mg | ORAL_TABLET | Freq: Every day | ORAL | 0 refills | Status: DC

## 2023-09-24 NOTE — Progress Notes (Signed)
 WEIGHT SUMMARY AND BIOMETRICS  Vitals Temp: 98.6 F (37 C) BP: 139/79 Pulse Rate: 65 SpO2: 98 %   Anthropometric Measurements Height: 6' 5 (1.956 m) Weight: (!) 333 lb (151 kg) BMI (Calculated): 39.48 Weight at Last Visit: 323 lb Weight Lost Since Last Visit: 0 Weight Gained Since Last Visit: 10 lb Starting Weight: 336 lb Total Weight Loss (lbs): 0 lb (0 kg)   Body Composition  Body Fat %: 36.6 % Fat Mass (lbs): 122 lbs Muscle Mass (lbs): 201 lbs Total Body Water (lbs): 146 lbs Visceral Fat Rating : 22   Other Clinical Data Fasting: no Labs: no Today's Visit #: 10 Starting Date: 07/30/22    Chief Complaint:   OBESITY Edward Johnson is here to discuss his progress with his obesity treatment plan.  He is on the the Category 3 Plan and keeping a food journal and adhering to recommended goals of 1500 calories and 100 protein and states he is following his eating plan approximately 75 % of the time.  He states he is exercising Walking 30 minutes 4 times per week.  Interim History:  Last OV at [facility] was 07/02/2023 Wegovy  was refilled and increased from 1.7mg  to max dose of 2.4mg  at that OV  He started on [facility] 12/2022, starting weight 333 5% weight loss equates to 16 lbs and 316 lbs  Weight at 07/02/2023 OV was 323 lbs  His insurance denied refill due to him not reaching required weight loss. Discussed this at length with pt Last dose of Wegovy  1.7mg  was late April 2025  We attempt to restart GLP-1 therapy today  He denies family hx of MENS 2 ot MTC He denies personal hx of pancreatitis  Of note-  Left first dorsal compartment release on 07/28/2023 with Dr. Willey.  Surgical site appears well approximated without s/s of infection today  Subjective:   1. Primary hypertension BP slightly above goal at OV He denies CP with exertion He has been walking regularly He uses a cane for ambulation  2. Pure hypercholesterolemia Lipid Panel     Component  Value Date/Time   CHOL 188 03/26/2023 1259   TRIG 80 03/26/2023 1259   HDL 59 03/26/2023 1259   CHOLHDL 3.2 03/26/2023 1259   CHOLHDL 4.6 01/29/2011 0602   VLDL 21 01/29/2011 0602   LDLCALC 114 (H) 03/26/2023 1259   LABVLDL 15 03/26/2023 1259    [facility] is managing daily Crestor  5mg  He denies myalgias  3. Insulin  resistance  Latest Reference Range & Units 07/30/22 09:32 03/26/23 12:59  INSULIN  2.6 - 24.9 uIU/mL 23.5 35.1 (H)  (H): Data is abnormally high  Started on Wegovy  0.25mg  Late Fall 2024 Increased Wegovy  0.25mg  to 0.5mg  on/about 01/29/2023 Increased Wegovy  0.5mg  to 1 mg on/about 03/26/2023 Increased Wegovy  1mg  to 1.7mg  on/about 05/12/2023 Increased Wegovy  1.7mg  to 2.4mg  on/about 07/02/2023- DENIED BY INSURANCE  Last dose of Wegovy  1.7mg  was late April 2025  When on GLP-1 therapy, he denies mass in neck, dysphagia, dyspepsia, persistent hoarseness, abdominal pain, or N/V/C    4. Vitamin D  deficiency   Latest Reference Range & Units 07/30/22 09:32 03/26/23 12:59  Vitamin D , 25-Hydroxy 30.0 - 100.0 ng/mL 19.0 (L) 49.1  (L): Data is abnormally low  Assessment/Plan:   1. Primary hypertension Limit Na+ Continue regular walking Monitor home BP  2. Pure hypercholesterolemia (Primary) Refill - rosuvastatin  (CRESTOR ) 5 MG tablet; Take 1 tablet (5 mg total) by mouth daily.  Dispense: 90 tablet; Refill: 0  3. Insulin   resistance Continue healthy eating and continue regular exercise Restart weekly GLP-1 therapy- if covered by insurance  4. Vitamin D  deficiency Refill - Vitamin D , Ergocalciferol , (DRISDOL ) 1.25 MG (50000 UNIT) CAPS capsule; Take 1 capsule (50,000 Units total) by mouth every 7 (seven) days.  Dispense: 4 capsule; Refill: 0  5. BMI 40.0-44.9, adult (HCC), Current BMI 39.48 Re Start Semaglutide -Weight Management (WEGOVY ) 0.25 MG/0.5ML SOAJ Inject 0.25 mg into the skin once a week. Dispense: 2 mL, Refills: 0 ordered   [name] is currently in the action stage of  change. As such, his goal is to continue with weight loss efforts. He has agreed to the Category 3 Plan and keeping a food journal and adhering to recommended goals of 1500 calories and 100 protein.   Exercise goals: All adults should avoid inactivity. Some physical activity is better than none, and adults who participate in any amount of physical activity gain some health benefits. Adults should also include muscle-strengthening activities that involve all major muscle groups on 2 or more days a week.  Behavioral modification strategies: increasing lean protein intake, decreasing simple carbohydrates, increasing vegetables, increasing water intake, no skipping meals, meal planning and cooking strategies, keeping healthy foods in the home, and planning for success.  [name] has agreed to follow-up with our clinic in 4 weeks. He was informed of the importance of frequent follow-up visits to maximize his success with intensive lifestyle modifications for his multiple health conditions.   Check Fasting Labs at next OV  Objective:   Blood pressure 139/79, pulse 65, temperature 98.6 F (37 C), height 6' 5 (1.956 m), weight (!) 333 lb (151 kg), SpO2 98%. Body mass index is 39.49 kg/m.  General: Cooperative, alert, well developed, in no acute distress. HEENT: Conjunctivae and lids unremarkable. Cardiovascular: Regular rhythm.  Lungs: Normal work of breathing. Neurologic: No focal deficits.   Lab Results  Component Value Date   CREATININE 0.93 03/26/2023   BUN 14 03/26/2023   NA 140 03/26/2023   K 4.1 03/26/2023   CL 101 03/26/2023   CO2 22 03/26/2023   Lab Results  Component Value Date   ALT 23 03/26/2023   AST 25 03/26/2023   ALKPHOS 64 03/26/2023   BILITOT 0.5 03/26/2023   Lab Results  Component Value Date   HGBA1C 5.1 03/26/2023   HGBA1C 5.3 07/30/2022   HGBA1C 5.6 07/28/2013   HGBA1C 5.5 01/28/2011   Lab Results  Component Value Date   INSULIN  35.1 (H) 03/26/2023    INSULIN  23.5 07/30/2022   Lab Results  Component Value Date   TSH 2.880 07/27/2013   Lab Results  Component Value Date   CHOL 188 03/26/2023   HDL 59 03/26/2023   LDLCALC 114 (H) 03/26/2023   TRIG 80 03/26/2023   CHOLHDL 3.2 03/26/2023   Lab Results  Component Value Date   VD25OH 49.1 03/26/2023   VD25OH 19.0 (L) 07/30/2022   Lab Results  Component Value Date   WBC 6.9 02/17/2022   HGB 12.5 (L) 02/17/2022   HCT 37.2 (L) 02/17/2022   MCV 95 02/17/2022   PLT 319 02/17/2022   No results found for: IRON, TIBC, FERRITIN  Attestation Statements:   Reviewed by clinician on day of visit: allergies, medications, problem list, medical history, surgical history, family history, social history, and previous encounter notes.  I have reviewed the above documentation for accuracy and completeness, and I agree with the above. -  Molina Hollenback d. Azadeh Hyder, NP-C

## 2023-09-29 ENCOUNTER — Telehealth (INDEPENDENT_AMBULATORY_CARE_PROVIDER_SITE_OTHER): Payer: Self-pay

## 2023-09-29 ENCOUNTER — Encounter (INDEPENDENT_AMBULATORY_CARE_PROVIDER_SITE_OTHER): Payer: Self-pay

## 2023-09-29 NOTE — Telephone Encounter (Signed)
 PA for Wegovy 0.25 has been submitted, awaiting PA questions.

## 2023-09-29 NOTE — Telephone Encounter (Signed)
 PA for Wegovy  already exist so this one could not be determined.    Message from Plan A new prior authorization (PA) request cannot be started at this time because there is a request already open for this drug. Please contact the PA call center if you have questions on the status of this request.

## 2023-10-29 ENCOUNTER — Ambulatory Visit (INDEPENDENT_AMBULATORY_CARE_PROVIDER_SITE_OTHER): Admitting: Adult Health

## 2023-12-02 ENCOUNTER — Ambulatory Visit (INDEPENDENT_AMBULATORY_CARE_PROVIDER_SITE_OTHER): Admitting: Adult Health

## 2023-12-08 ENCOUNTER — Ambulatory Visit (INDEPENDENT_AMBULATORY_CARE_PROVIDER_SITE_OTHER): Admitting: Adult Health

## 2024-01-04 ENCOUNTER — Ambulatory Visit (INDEPENDENT_AMBULATORY_CARE_PROVIDER_SITE_OTHER): Admitting: Adult Health

## 2024-01-08 ENCOUNTER — Telehealth (HOSPITAL_BASED_OUTPATIENT_CLINIC_OR_DEPARTMENT_OTHER): Payer: Self-pay

## 2024-01-08 NOTE — Telephone Encounter (Signed)
 Pharmacy please advise on holding Xarelto  prior to Right Knee Scope  scheduled for TBD. Last labs (per [facility], 12/17/2023) Thank you.

## 2024-01-08 NOTE — Telephone Encounter (Signed)
   Pre-operative Risk Assessment    Patient Name: Edward Johnson  DOB: 08-05-1970 MRN: [id number]   Date of last office visit: 07/24/23 [name], NP Date of next office visit: NA   Request for Surgical Clearance    Procedure:  Right Knee Scope  Date of Surgery:  Clearance TBD                                 Surgeon:  Dr. Cristy Surgeon's Group or Practice Name:  [facility] Phone number:  [phone] Fax number:  [phone]   Type of Clearance Requested:   - Medical  - Pharmacy:  Hold Rivaroxaban  (Xarelto ) not indicated   Type of Anesthesia:  General    Additional requests/questions:    Bonney Augustin JONETTA Delores   01/08/2024, 8:29 AM

## 2024-01-14 NOTE — Telephone Encounter (Signed)
 I called patient made him aware clearance is being worked on patient voiced understanding.  Waiting on pharmacy and to see if patient need a VV or office visit

## 2024-01-14 NOTE — Telephone Encounter (Signed)
 Pt calling to f/u on clearance. Please advise.

## 2024-01-15 ENCOUNTER — Telehealth (HOSPITAL_BASED_OUTPATIENT_CLINIC_OR_DEPARTMENT_OTHER): Payer: Self-pay | Admitting: *Deleted

## 2024-01-15 NOTE — Telephone Encounter (Signed)
 S/w the pt and he has been scheduled tele preop appt 01/22/24. Pt stated Dr. Cristy waiting on clearance before scheduling procedure.   Med rec and consent are done.      Patient Consent for Virtual Visit        Edward Johnson has provided verbal consent on 01/15/2024 for a virtual visit (video or telephone).   CONSENT FOR VIRTUAL VISIT FOR:  Edward Johnson  By participating in this virtual visit I agree to the following:  I hereby voluntarily request, consent and authorize [facility] and its employed or contracted physicians, physician assistants, nurse practitioners or other licensed health care professionals (the Practitioner), to provide me with telemedicine health care services (the "Services) as deemed necessary by the treating Practitioner. I acknowledge and consent to receive the Services by the Practitioner via telemedicine. I understand that the telemedicine visit will involve communicating with the Practitioner through live audiovisual communication technology and the disclosure of certain medical information by electronic transmission. I acknowledge that I have been given the opportunity to request an in-person assessment or other available alternative prior to the telemedicine visit and am voluntarily participating in the telemedicine visit.  I understand that I have the right to withhold or withdraw my consent to the use of telemedicine in the course of my care at any time, without affecting my right to future care or treatment, and that the Practitioner or I may terminate the telemedicine visit at any time. I understand that I have the right to inspect all information obtained and/or recorded in the course of the telemedicine visit and may receive copies of available information for a reasonable fee.  I understand that some of the potential risks of receiving the Services via telemedicine include:  Delay or interruption in medical evaluation due to technological equipment  failure or disruption; Information transmitted may not be sufficient (e.g. poor resolution of images) to allow for appropriate medical decision making by the Practitioner; and/or  In rare instances, security protocols could fail, causing a breach of personal health information.  Furthermore, I acknowledge that it is my responsibility to provide information about my medical history, conditions and care that is complete and accurate to the best of my ability. I acknowledge that Practitioner's advice, recommendations, and/or decision may be based on factors not within their control, such as incomplete or inaccurate data provided by me or distortions of diagnostic images or specimens that may result from electronic transmissions. I understand that the practice of medicine is not an exact science and that Practitioner makes no warranties or guarantees regarding treatment outcomes. I acknowledge that a copy of this consent can be made available to me via my patient portal [facility] MyChart), or I can request a printed copy by calling the office of [facility].    I understand that my insurance will be billed for this visit.   I have read or had this consent read to me. I understand the contents of this consent, which adequately explains the benefits and risks of the Services being provided via telemedicine.  I have been provided ample opportunity to ask questions regarding this consent and the Services and have had my questions answered to my satisfaction. I give my informed consent for the services to be provided through the use of telemedicine in my medical care

## 2024-01-15 NOTE — Telephone Encounter (Signed)
 Primary Cardiologist:[place] ONEIDA Decent, MD   Preoperative team, please contact this patient and set up a phone call appointment for further preoperative risk assessment. Please obtain consent and complete medication review. Thank you for your help.   I confirm that guidance regarding antiplatelet and oral anticoagulation therapy has been completed and, if necessary, noted below.  Per office protocol, patient can hold Xarelto  for 2 days prior to procedure.   I also confirmed the patient resides in the state of [place] . As per [facility] Medical Board telemedicine laws, the patient must reside in the state in which the provider is licensed.   Edward EMERSON Bane, Edward Johnson  01/15/2024, 10:56 AM [address] [place], [place] [address] Office [phone] Fax [phone]

## 2024-01-15 NOTE — Telephone Encounter (Signed)
 Patient returned Pre-op call.

## 2024-01-15 NOTE — Telephone Encounter (Signed)
 Patient with diagnosis of afib on Xarelto  for anticoagulation.    Procedure: Right Knee Scope  Date of procedure: TBD   CHA2DS2-VASc Score = 2   This indicates a 2.2% annual risk of stroke. The patient's score is based upon: CHF History: 0 HTN History: 1 Diabetes History: 1 Stroke History: 0 Vascular Disease History: 0 Age Score: 0 Gender Score: 0      CrCl 149 ml/min Platelet count 307  Patient has not had an Afib/aflutter ablation in the last 3 months, DCCV within the last 4 weeks or a watchman implanted in the last 45 days   Per office protocol, patient can hold Xarelto  for 2 days prior to procedure.    **This guidance is not considered finalized until pre-operative APP has relayed final recommendations.**

## 2024-01-15 NOTE — Telephone Encounter (Signed)
 S/w the pt and he has been scheduled tele preop appt 01/22/24. Pt stated Dr. Cristy waiting on clearance before scheduling procedure.   Med rec and consent are done.

## 2024-01-15 NOTE — Telephone Encounter (Signed)
Left message to call back and schedule tele pre op appt

## 2024-01-15 NOTE — Telephone Encounter (Signed)
 I will forward back to preop for further advice if pt will need an appt once pharm recommendations are received.

## 2024-01-22 ENCOUNTER — Ambulatory Visit: Attending: Cardiology | Admitting: Cardiology

## 2024-01-22 DIAGNOSIS — Z01818 Encounter for other preprocedural examination: Secondary | ICD-10-CM

## 2024-01-22 DIAGNOSIS — Z01810 Encounter for preprocedural cardiovascular examination: Secondary | ICD-10-CM

## 2024-01-22 NOTE — Progress Notes (Signed)
 Virtual Visit via Telephone Note   Because of Edward Johnson co-morbid illnesses, he is at least at moderate risk for complications without adequate follow up.  This format is felt to be most appropriate for this patient at this time.  Due to technical limitations with video connection (technology), today's appointment will be conducted as an audio only telehealth visit, and [name] verbally agreed to proceed in this manner.   All issues noted in this document were discussed and addressed.  No physical exam could be performed with this format.  Evaluation Performed:  Preoperative cardiovascular risk assessment _____________   Date:  01/22/2024   Patient ID:  Edward Johnson, DOB 1970/12/10, MRN [id number] Patient Location:  Home Provider location:   Office  Primary Care Provider:  Pura Lenis, MD Primary Cardiologist:  Darry[place] ONEIDA Decent, MD  Chief Complaint / Patient Profile   53 y.o. y/o male with a h/o PAF s/p multiple cardioversions, hypertension, obesity, DM2, dyslipidemia who is pending right knee scope and presents today for telephonic preoperative cardiovascular risk assessment.  History of Present Illness    Edward Johnson is a 53 y.o. male who presents via audio conferencing for a telehealth visit today.  Pt was last seen in cardiology clinic on 07/24/2023 by [name], NP.  At that time [name] was doing well from a cardiac perspective and pending upcoming surgery at that time.  The patient is now pending procedure as outlined above. Since his last visit, he has been doing well. He is dealing with significant knee pain, but otherwise independent. Well versed  when his heart is out of rhythm, and no recurrent episodes ~ last few years. He denies chest pain, palpitations, dyspnea, pnd, orthopnea, n, v, dizziness, syncope, edema, weight gain, or early satiety.     Past Medical History    Past Medical History:  Diagnosis Date   Anxiety    Atrial fibrillation (HCC)     history of paroxysmal afib, s/p TEE cardioversion (2005, 2007, 2012), and chemical cardioversion with flecainide (2005, 2011)   Back pain    Depression    History of noncompliance with medical treatment    Hypertension    Morbid obesity [facility])    Past Surgical History:  Procedure Laterality Date   CARDIOVERSION  01/29/2011   Procedure: CARDIOVERSION;  Surgeon: Redell GORMAN Shallow, MD;  Location[facility] ENDOSCOPY;  Service: Cardiovascular;  Laterality: N/A;   CARDIOVERSION N/A 07/28/2013   Procedure: CARDIOVERSION;  Surgeon: Jerel Balding, MD;  Location: [facility] ENDOSCOPY;  Service: Cardiovascular;  Laterality: N/A;   TEE WITHOUT CARDIOVERSION  01/29/2011   Procedure: TRANSESOPHAGEAL ECHOCARDIOGRAM (TEE);  Surgeon: Redell GORMAN Shallow, MD;  Location: [facility] ENDOSCOPY;  Service: Cardiovascular;  Laterality: N/A;   TEE WITHOUT CARDIOVERSION N/A 07/28/2013   Procedure: TRANSESOPHAGEAL ECHOCARDIOGRAM (TEE);  Surgeon: Jerel Balding, MD;  Location: [facility] ENDOSCOPY;  Service: Cardiovascular;  Laterality: N/A;  Supposed to be TEE/Cardioversion, Trish will call anesthesia in the morning.     Allergies  No Known Allergies  Home Medications    Prior to Admission medications   Medication Sig Start Date End Date Taking? Authorizing Provider  ACCU-CHEK GUIDE TEST test strip USE 1 STRIP TWICE DAILY FOR 90 DAYS 05/19/23   [provider]  Accu-Chek Softclix Lancets lancets SMARTSIG:Topical 09/22/23   [provider]  amLODipine  (NORVASC ) 10 MG tablet Take 1 tablet (10 mg total) by mouth daily. 02/17/22 01/15/24  Loistine Sober, NP  amoxicillin (AMOXIL) 500 MG capsule Take 500 mg by mouth 3 (  three) times daily. Patient not taking: Reported on 01/15/2024 09/23/23   [provider]  cyclobenzaprine (FLEXERIL) 10 MG tablet 1 tablet at bedtime as needed Orally Once a day as needed for 30 days    [provider]  HYDROcodone -acetaminophen  (NORCO/VICODIN) 5-325 MG tablet Take 1 tablet by mouth  every 6 (six) hours as needed. 12/27/18   Loetta Senior, MD  ibuprofen (ADVIL) 800 MG tablet Take 800 mg by mouth every 6 (six) hours as needed. 09/23/23   [provider]  lisinopril -hydrochlorothiazide (ZESTORETIC) 20-12.5 MG tablet Take 1 tablet by mouth daily. 09/22/23   [provider]  loxapine (LOXITANE) 10 MG capsule Take by mouth. 07/01/23   [provider]  metFORMIN (GLUCOPHAGE) 1000 MG tablet Take 1,000 mg by mouth daily.    [provider]  methocarbamol  (ROBAXIN ) 500 MG tablet Take 1 tablet (500 mg total) by mouth 2 (two) times daily. 02/14/19   Layden, Lindsey A, PA-C  naproxen (NAPROSYN) 500 MG tablet Take 500 mg by mouth 2 (two) times daily as needed. 07/08/23   [provider]  omeprazole (PRILOSEC) 40 MG capsule Take 40 mg by mouth every morning. 05/12/23   [provider]  oxyCODONE -acetaminophen  (PERCOCET/ROXICET) 5-325 MG tablet SMARTSIG:1 Tablet(s) By Mouth Every 12 Hours 05/16/23   [provider]  rivaroxaban  (XARELTO ) 20 MG TABS tablet Take 1 tablet (20 mg total) by mouth daily with supper. 07/24/23   Walker, Caitlin S, NP  rosuvastatin  (CRESTOR ) 5 MG tablet Take 1 tablet (5 mg total) by mouth daily. 09/24/23   Danford, Katy D, NP  Semaglutide -Weight Management (WEGOVY ) 0.25 MG/0.5ML SOAJ Inject 0.25 mg into the skin once a week. 09/24/23   Danford, Katy D, NP  sildenafil (VIAGRA) 50 MG tablet Take 50 mg by mouth as needed. 05/11/23   [provider]  valACYclovir (VALTREX) 1000 MG tablet Take 1,000 mg by mouth 2 (two) times daily as needed. 07/08/23   [provider]  Vitamin D , Ergocalciferol , (DRISDOL ) 1.25 MG (50000 UNIT) CAPS capsule Take 1 capsule (50,000 Units total) by mouth every 7 (seven) days. 09/24/23   Jonel Rockie BIRCH, NP    Physical Exam    Vital Signs:  Edward Johnson does not have vital signs available for review today.  Given telephonic nature of communication, physical exam is  limited. AAOx3. NAD. Normal affect.  Speech and respirations are unlabored.  Accessory Clinical Findings    None  Assessment & Plan    1.  Preoperative Cardiovascular Risk Assessment: According to the Revised Cardiac Risk Index (RCRI), his Perioperative Risk of Major Cardiac Event is (%): 0.4 His Functional Capacity in METs is: 6.28 according to the [facility] Activity Status Index (DASI). Therefore, based on ACC/AHA guidelines, patient would be at acceptable risk for the planned procedure without further cardiovascular testing. I will route this recommendation to the requesting party via Epic fax function.   Per office protocol, patient can hold Xarelto  for 2 days prior to procedure and resume as soon as possible as determined by the surgeon.   The patient was advised that if he develops new symptoms prior to surgery to contact our office to arrange for a follow-up visit, and he verbalized understanding.   A copy of this note will be routed to requesting surgeon.  Time:   Today, I have spent 10 minutes with the patient with telehealth technology discussing medical history, symptoms, and management plan.     Delon JAYSON Hoover, NP  01/22/2024, 8:03 AM

## 2024-02-04 ENCOUNTER — Ambulatory Visit (INDEPENDENT_AMBULATORY_CARE_PROVIDER_SITE_OTHER): Payer: Self-pay | Admitting: Adult Health

## 2024-02-19 ENCOUNTER — Other Ambulatory Visit: Payer: Self-pay

## 2024-02-19 ENCOUNTER — Encounter (HOSPITAL_BASED_OUTPATIENT_CLINIC_OR_DEPARTMENT_OTHER): Payer: Self-pay | Admitting: Orthopaedic Surgery

## 2024-02-19 NOTE — Progress Notes (Signed)
   02/19/24 1004  PAT Phone Screen  Is the patient taking a GLP-1 receptor agonist? No  Do You Have Diabetes? No  Do You Have Hypertension? Yes  Have You Ever Been to the ER for Asthma? No  Have You Taken Oral Steroids in the Past 3 Months? No  Do you Take Phenteramine or any Other Diet Drugs? No  Recent  Lab Work, EKG, CXR? Yes  Where was this test performed? 07-24-23 EKG  Do you have a history of heart problems? (S)  Yes (PAF, HTN)  Cardiologist Name Dr Barbaraann  Have you ever had tests on your heart? Yes  What cardiac tests were performed? Echo  What date/year were cardiac tests completed? 06-20-19 ECHO EF 60-65%  Results viewable: [facility] Media Tab  Any Recent Hospitalizations? No  Height 6' 6 (1.981 m)  Weight (!) 148.8 kg  Pat Appointment Scheduled (S)  Yes (BMP)

## 2024-02-22 NOTE — H&P (Signed)
 PREOPERATIVE H&P  Chief Complaint: medial meniscus tear of left knee, Chondromalacia patellae of left knee  HPI: Edward Johnson is a 53 y.o. male who is scheduled for, Procedure(s): ARTHROSCOPY, KNEE, WITH MEDIAL MENISCECTOMY CHONDROPLASTY.   Patient is a 53 year old male been following him in the office for the last few weeks now he had about a 3-week onset of pain on the left knee prior to that specific injury or trauma. He noted a lot of medial pain and swelling. We had an aspiration and injection with the knee at the last visit had about 50 cc of bloody yellowish fluid. He did get temporary relief with the aspiration but medial pain still persists.  Symptoms are rated as moderate to severe, and have been worsening.  This is significantly impairing activities of daily living.    Please see clinic note for further details on this patient's care.    He has elected for surgical management.   Past Medical History:  Diagnosis Date   Anxiety    Atrial fibrillation (HCC)    history of paroxysmal afib, s/p TEE cardioversion (2005, 2007, 2012), and chemical cardioversion with flecainide (2005, 2011)   Back pain    Depression    GERD (gastroesophageal reflux disease)    History of noncompliance with medical treatment    Hypertension    Morbid obesity (HCC)    Pre-diabetes    Past Surgical History:  Procedure Laterality Date   CARDIOVERSION  01/29/2011   Procedure: CARDIOVERSION;  Surgeon: Redell GORMAN Shallow, MD;  Location: [facility] ENDOSCOPY;  Service: Cardiovascular;  Laterality: N/A;   CARDIOVERSION N/A 07/28/2013   Procedure: CARDIOVERSION;  Surgeon: Jerel Balding, MD;  Location: [facility] ENDOSCOPY;  Service: Cardiovascular;  Laterality: N/A;   COLONOSCOPY     TEE WITHOUT CARDIOVERSION  01/29/2011   Procedure: TRANSESOPHAGEAL ECHOCARDIOGRAM (TEE);  Surgeon: Redell GORMAN Shallow, MD;  Location: [facility] ENDOSCOPY;  Service: Cardiovascular;  Laterality: N/A;   TEE WITHOUT CARDIOVERSION N/A 07/28/2013    Procedure: TRANSESOPHAGEAL ECHOCARDIOGRAM (TEE);  Surgeon: Jerel Balding, MD;  Location: [facility] ENDOSCOPY;  Service: Cardiovascular;  Laterality: N/A;  Supposed to be TEE/Cardioversion, Trish will call anesthesia in the morning.    Social History   Socioeconomic History   Marital status: Single    Spouse name: Not on file   Number of children: Not on file   Years of education: Not on file   Highest education level: Not on file  Occupational History   Not on file  Tobacco Use   Smoking status: Never   Smokeless tobacco: Never  Vaping Use   Vaping status: Never Used  Substance and Sexual Activity   Alcohol use: Not Currently   Drug use: No   Sexual activity: Not on file  Other Topics Concern   Not on file  Social History Narrative   Not on file   Social Drivers of Health   Financial Resource Strain: Low Risk (07/29/2022)   Received from [facility]   Overall Financial Resource Strain (CARDIA)    Difficulty of Paying Living Expenses: Not very hard  Food Insecurity: No Food Insecurity (07/29/2022)   Received from [facility]   Hunger Vital Sign    Within the past 12 months, you worried that your food would run out before you got the money to buy more.: Never true    Within the past 12 months, the food you bought just didn't last and you didn't have money to get more.: Never true  Transportation Needs: No  Transportation Needs (07/29/2022)   Received from [facility]   PRAPARE - Transportation    Lack of Transportation (Medical): No    Lack of Transportation (Non-Medical): No  Physical Activity: Unknown (10/29/2021)   Received from [facility]   Exercise Vital Sign    On average, how many days per week do you engage in moderate to strenuous exercise (like a brisk walk)?: 0 days    Minutes of Exercise per Session: Not on file  Stress: Stress Concern Present (10/29/2021)   Received from [facility] - Occupational Stress  Questionnaire    Feeling of Stress : To some extent  Social Connections: Socially Isolated (10/29/2021)   Received from [facility]   Social Network    How would you rate your social network (family, work, friends)?: Little participation, lonely and socially isolated   Family History  Problem Relation Age of Onset   Cancer Mother    Hypertension Mother    Cancer - Other Mother        Uterine   CAD Neg Hx    No Known Allergies Prior to Admission medications   Medication Sig Start Date End Date Taking? Authorizing Provider  cyclobenzaprine (FLEXERIL) 10 MG tablet 1 tablet at bedtime as needed Orally Once a day as needed for 30 days   Yes [provider]  ibuprofen (ADVIL) 800 MG tablet Take 800 mg by mouth every 6 (six) hours as needed. 09/23/23  Yes [provider]  lisinopril -hydrochlorothiazide (ZESTORETIC) 20-12.5 MG tablet Take 1 tablet by mouth daily. 09/22/23  Yes [provider]  omeprazole (PRILOSEC) 40 MG capsule Take 40 mg by mouth every morning. 05/12/23  Yes [provider]  rivaroxaban  (XARELTO ) 20 MG TABS tablet Take 1 tablet (20 mg total) by mouth daily with supper. 07/24/23  Yes Walker, Caitlin S, NP  valACYclovir (VALTREX) 1000 MG tablet Take 1,000 mg by mouth 2 (two) times daily as needed. 07/08/23  Yes [provider]  ACCU-CHEK GUIDE TEST test strip USE 1 STRIP TWICE DAILY FOR 90 DAYS 05/19/23   [provider]  Accu-Chek Softclix Lancets lancets SMARTSIG:Topical 09/22/23   [provider]  sildenafil (VIAGRA) 50 MG tablet Take 50 mg by mouth as needed. 05/11/23   [provider]    ROS: All other systems have been reviewed and were otherwise negative with the exception of those mentioned in the HPI and as above.  Physical Exam: General: Alert, no acute distress Cardiovascular: No pedal edema Respiratory: No cyanosis, no use of accessory musculature GI: No organomegaly, abdomen is soft and  non-tender Skin: No lesions in the area of chief complaint Neurologic: Sensation intact distally Psychiatric: Patient is competent for consent with normal mood and affect Lymphatic: No axillary or cervical lymphadenopathy  MUSCULOSKELETAL:  Patient seated in the exam room with some obvious discomfort favoring the left knee alert and oriented appears appropriate age. Examination left lower extremity shows neurovascularly intact. He has exquisite medial joint line tenderness to palpation there is no lateral tenderness noted. He does have a positive effusion and tightness through range of motion. Positive McMurray stable Lachman. Antalgic gait favoring the left knee.  Imaging: MRI which shows a horizontal tear of the posterior horn and body medial meniscus without displaced meniscal flap. He had tricompartmental OA with moderate size reactive joint effusion mild. Slightly elongated medial plica without significant thickening. And slight thickening and edema in the MCL suggesting possible sprain  Assessment: medial  meniscus tear of left knee, Chondromalacia patellae of left knee  Plan: Plan for Procedure(s): ARTHROSCOPY, KNEE, WITH MEDIAL MENISCECTOMY CHONDROPLASTY  The risks benefits and alternatives were discussed with the patient including but not limited to the risks of nonoperative treatment, versus surgical intervention including infection, bleeding, nerve injury,  blood clots, cardiopulmonary complications, morbidity, mortality, among others, and they were willing to proceed.   The patient acknowledged the explanation, agreed to proceed with the plan and consent was signed.   Operative Plan: LKS MM  Discharge Medications: standard DVT Prophylaxis: aspirin  Physical Therapy: outpatient Special Discharge needs: +/-   Aleck LOISE Stalling, PA-C  02/22/2024 3:16 PM

## 2024-02-24 ENCOUNTER — Encounter (HOSPITAL_BASED_OUTPATIENT_CLINIC_OR_DEPARTMENT_OTHER)
Admission: RE | Admit: 2024-02-24 | Discharge: 2024-02-24 | Disposition: A | Discharge status: Home / Self Care | Source: Ambulatory Visit | Attending: Orthopaedic Surgery | Admitting: Orthopaedic Surgery

## 2024-02-24 LAB — BASIC METABOLIC PANEL WITH GFR
Anion gap: 8 (ref 5–15)
BUN: 12 mg/dL (ref 6–20)
CO2: 27 mmol/L (ref 22–32)
Calcium: 9 mg/dL (ref 8.9–10.3)
Chloride: 104 mmol/L (ref 98–111)
Creatinine, Ser: 0.8 mg/dL (ref 0.61–1.24)
GFR, Estimated: >60 mL/min (ref >60)
Glucose, Bld: 79 mg/dL (ref 70–99)
Potassium: 3.9 mmol/L (ref 3.5–5.1)
Sodium: 139 mmol/L (ref 135–145)

## 2024-02-24 NOTE — Discharge Instructions (Addendum)
 Bonner Hair MD, MPH Aleck Stalling, PA-C [facility] [address], [address] [phone] (tel)   [phone] (fax)   POST-OPERATIVE INSTRUCTIONS - Knee Arthroscopy  WOUND CARE - You may remove the Operative Dressing on Post-Op Day #3 (72hrs after surgery).   -  Alternatively if you would like you can leave dressing on until follow-up if within 7-8 days but keep it dry. - Leave steri-strips in place until they fall off on their own, usually 2 weeks postop. - An ACE wrap may be used to control swelling, do not wrap this too tight.  If the initial ACE wrap feels too tight you may loosen it. - There may be a small amount of fluid/bleeding leaking at the surgical site.  - This is normal; the knee is filled with fluid during the procedure and can leak for 24-48hrs after surgery. You may change/reinforce the bandage as needed.  - Use the Cryocuff or Ice as often as possible for the first 7 days, then as needed for pain relief. Always keep a towel, ACE wrap or other barrier between the cooling unit and your skin.  - You may shower on Post-Op Day #3. Gently pat the area dry.  - Do not soak the knee in water or submerge it.  - Do not go swimming in the pool or ocean until 4 weeks after surgery or when otherwise instructed.  Keep dry incisions as dry as possible.   BRACE/AMBULATION  -            You will not need a brace after this procedure.   - You may use crutches initially to help you weight bear, but this is not required - You can put full weight on your operative leg as you feel comfortable  PHYSICAL THERAPY - You will begin physical therapy soon after surgery (unless otherwise specified) - Please call to set up an appointment, if you do not already have one  - Let our office if there are any issues with scheduling your therapy    REGIONAL ANESTHESIA (NERVE BLOCKS) The anesthesia team may have performed a nerve block for you this is a great tool used to  minimize pain.   The block may start wearing off overnight (between 8-24 hours postop) When the block wears off, your pain may go from nearly zero to the pain you would have had postop without the block. This is an abrupt transition but nothing dangerous is happening.   This can be a challenging period but utilize your as needed pain medications to try and manage this period. We suggest you use the pain medication the first night prior to going to bed, to ease this transition.  You may take an extra dose of narcotic when this happens if needed   POST-OP MEDICATIONS- Multimodal approach to pain control In general your pain will be controlled with a combination of substances.  Prescriptions unless otherwise discussed are electronically sent to your pharmacy.  This is a carefully made plan we use to minimize narcotic use.     Meloxicam - Anti-inflammatory medication taken on a scheduled basis Acetaminophen  - Non-narcotic pain medicine taken on a scheduled basis  Tramadol  - This is a strong narcotic, to be used only on an as needed basis for SEVERE pain. Zofran - take as needed for nausea  You may resume your Xarelto  24 hours after surgery   FOLLOW-UP   Please call the office to schedule a follow-up appointment for your incision  check, 7-10 days post-operatively.   IF YOU HAVE ANY QUESTIONS, PLEASE FEEL FREE TO CALL OUR OFFICE.   HELPFUL INFORMATION   Keep your leg elevated to decrease swelling, which will then in turn decrease your pain. I would elevate the foot of your bed by putting a couple of couch pillows between your mattress and box spring. I would not keep pillow directly under your ankle.  - Do not sleep with a pillow behind your knee even if it is more comfortable as this may make it harder to get your knee fully straight long term.   There will be MORE swelling on days 1-3 than there is on the day of surgery.  This also is normal. The swelling will decrease with the  anti-inflammatory medication, ice and keeping it elevated. The swelling will make it more difficult to bend your knee. As the swelling goes down your motion will become easier   You may develop swelling and bruising that extends from your knee down to your calf and perhaps even to your foot over the next week. Do not be alarmed. This too is normal, and it is due to gravity   There may be some numbness adjacent to the incision site. This may last for 6-12 months or longer in some patients and is expected.   You may return to sedentary work/school in the next couple of days when you feel up to it. You will need to keep your leg elevated as much as possible    You should wean off your narcotic medicines as soon as you are able.  Most patients will be off narcotics before their first postop appointment.    We suggest you use the pain medication the first night prior to going to bed, in order to ease any pain when the anesthesia wears off. You should avoid taking pain medications on an empty stomach as it will make you nauseous.   Do not drink alcoholic beverages or take illicit drugs when taking pain medications.   It is against the law to drive while taking narcotics. You cannot drive if your Right leg is in brace locked in extension.   Pain medication may make you constipated.  Below are a few solutions to try in this order:  o Decrease the amount of pain medication if you aren't having pain.  o Drink lots of decaffeinated fluids.  o Drink prune juice and/or eat dried prunes   o If the first 3 don't work start with additional solutions  o Take Colace - an over-the-counter stool softener  o Take Senokot - an over-the-counter laxative  o Take Miralax - a stronger over-the-counter laxative    For more information including helpful videos and documents visit our website:   [email or web address]    Post Anesthesia Home Care  Instructions  Activity: Get plenty of rest for the remainder of the day. A responsible individual must stay with you for 24 hours following the procedure.  For the next 24 hours, DO NOT: -Drive a car -Advertising copywriter -Drink alcoholic beverages -Take any medication unless instructed by your physician -Make any legal decisions or sign important papers.  Meals: Start with liquid foods such as gelatin or soup. Progress to regular foods as tolerated. Avoid greasy, spicy, heavy foods. If nausea and/or vomiting occur, drink only clear liquids until the nausea and/or vomiting subsides. Call your physician if vomiting continues.  Special Instructions/Symptoms: Your throat may feel dry or sore from the anesthesia or the breathing tube  placed in your throat during surgery. If this causes discomfort, gargle with warm salt water. The discomfort should disappear within 24 hours.  If you had a scopolamine patch placed behind your ear for the management of post- operative nausea and/or vomiting:  1. The medication in the patch is effective for 72 hours, after which it should be removed.  Wrap patch in a tissue and discard in the trash. Wash hands thoroughly with soap and water. 2. You may remove the patch earlier than 72 hours if you experience unpleasant side effects which may include dry mouth, dizziness or visual disturbances. 3. Avoid touching the patch. Wash your hands with soap and water after contact with the patch.    No Tylenol  until after 4pm today if needed

## 2024-02-25 ENCOUNTER — Encounter (HOSPITAL_BASED_OUTPATIENT_CLINIC_OR_DEPARTMENT_OTHER): Payer: Self-pay | Admitting: Orthopaedic Surgery

## 2024-02-25 ENCOUNTER — Other Ambulatory Visit: Payer: Self-pay

## 2024-02-25 ENCOUNTER — Ambulatory Visit (HOSPITAL_BASED_OUTPATIENT_CLINIC_OR_DEPARTMENT_OTHER): Admitting: Certified Registered"

## 2024-02-25 ENCOUNTER — Encounter (HOSPITAL_BASED_OUTPATIENT_CLINIC_OR_DEPARTMENT_OTHER): Admission: RE | Disposition: A | Payer: Self-pay | Source: Home / Self Care | Attending: Orthopaedic Surgery

## 2024-02-25 ENCOUNTER — Ambulatory Visit (HOSPITAL_BASED_OUTPATIENT_CLINIC_OR_DEPARTMENT_OTHER)
Admission: RE | Admit: 2024-02-25 | Discharge: 2024-02-25 | Disposition: A | Attending: Orthopaedic Surgery | Admitting: Orthopaedic Surgery

## 2024-02-25 DIAGNOSIS — I10 Essential (primary) hypertension: Secondary | ICD-10-CM

## 2024-02-25 HISTORY — PX: CHONDROPLASTY: SHX5177

## 2024-02-25 HISTORY — DX: Gastro-esophageal reflux disease without esophagitis: K21.9

## 2024-02-25 HISTORY — PX: KNEE ARTHROSCOPY WITH MEDIAL MENISECTOMY: SHX5651

## 2024-02-25 HISTORY — DX: Prediabetes: R73.03

## 2024-02-25 SURGERY — ARTHROSCOPY, KNEE, WITH MEDIAL MENISCECTOMY
Anesthesia: General | Site: Knee | Laterality: Left

## 2024-02-25 MED ORDER — FENTANYL CITRATE (PF) 100 MCG/2ML IJ SOLN
INTRAMUSCULAR | Status: DC | PRN
  Administered 2024-02-25 (×2): 50 ug via INTRAVENOUS

## 2024-02-25 MED ORDER — FENTANYL CITRATE (PF) 100 MCG/2ML IJ SOLN
INTRAMUSCULAR | Status: AC
  Filled 2024-02-25: qty 2

## 2024-02-25 MED ORDER — DEXAMETHASONE SOD PHOSPHATE PF 10 MG/ML IJ SOLN
INTRAMUSCULAR | Status: DC | PRN
  Administered 2024-02-25: 10 mg via INTRAVENOUS

## 2024-02-25 MED ORDER — GABAPENTIN 300 MG PO CAPS
ORAL_CAPSULE | ORAL | Status: AC
  Filled 2024-02-25: qty 1

## 2024-02-25 MED ORDER — MIDAZOLAM HCL 5 MG/5ML IJ SOLN
INTRAMUSCULAR | Status: DC | PRN
  Administered 2024-02-25: 2 mg via INTRAVENOUS

## 2024-02-25 MED ORDER — BUPIVACAINE HCL (PF) 0.25 % IJ SOLN
INTRAMUSCULAR | Status: DC | PRN
  Administered 2024-02-25: 20 mL

## 2024-02-25 MED ORDER — CEFAZOLIN SODIUM-DEXTROSE 1-4 GM/50ML-% IV SOLN
INTRAVENOUS | Status: AC
  Filled 2024-02-25: qty 50

## 2024-02-25 MED ORDER — AMISULPRIDE (ANTIEMETIC) 5 MG/2ML IV SOLN: 10.0000 mg | Freq: Once | INTRAVENOUS | Status: DC | PRN

## 2024-02-25 MED ORDER — ONDANSETRON HCL 4 MG/2ML IJ SOLN
INTRAMUSCULAR | Status: DC | PRN
  Administered 2024-02-25: 4 mg via INTRAVENOUS

## 2024-02-25 MED ORDER — FENTANYL CITRATE (PF) 100 MCG/2ML IJ SOLN
25.0000 ug | INTRAMUSCULAR | Status: DC | PRN
  Administered 2024-02-25: 50 ug via INTRAVENOUS
  Administered 2024-02-25: 25 ug via INTRAVENOUS

## 2024-02-25 MED ORDER — PROPOFOL 10 MG/ML IV BOLUS
INTRAVENOUS | Status: DC | PRN
  Administered 2024-02-25: 200 mg via INTRAVENOUS
  Administered 2024-02-25: 100 mg via INTRAVENOUS

## 2024-02-25 MED ORDER — TRAMADOL HCL 50 MG PO TABS: 50.0000 mg | ORAL_TABLET | Freq: Four times a day (QID) | ORAL | 0 refills | Status: AC | PRN

## 2024-02-25 MED ORDER — OXYCODONE HCL 5 MG PO TABS
ORAL_TABLET | ORAL | Status: AC
  Filled 2024-02-25: qty 1

## 2024-02-25 MED ORDER — LIDOCAINE HCL (CARDIAC) PF 100 MG/5ML IV SOSY
PREFILLED_SYRINGE | INTRAVENOUS | Status: DC | PRN
  Administered 2024-02-25: 100 mg via INTRAVENOUS

## 2024-02-25 MED ORDER — ACETAMINOPHEN 500 MG PO TABS
1000.0000 mg | ORAL_TABLET | Freq: Once | ORAL | Status: AC
  Administered 2024-02-25: 1000 mg via ORAL

## 2024-02-25 MED ORDER — MIDAZOLAM HCL 2 MG/2ML IJ SOLN
INTRAMUSCULAR | Status: AC
  Filled 2024-02-25: qty 2

## 2024-02-25 MED ORDER — CEFAZOLIN SODIUM-DEXTROSE 3-4 GM/150ML-% IV SOLN
3.0000 g | INTRAVENOUS | Status: AC
  Administered 2024-02-25: 3 g via INTRAVENOUS

## 2024-02-25 MED ORDER — SODIUM CHLORIDE 0.9 % IR SOLN
Status: DC | PRN
  Administered 2024-02-25: 3000 mL

## 2024-02-25 MED ORDER — OXYCODONE HCL 5 MG PO TABS
5.0000 mg | ORAL_TABLET | Freq: Once | ORAL | Status: AC | PRN
  Administered 2024-02-25: 5 mg via ORAL

## 2024-02-25 MED ORDER — GABAPENTIN 300 MG PO CAPS
300.0000 mg | ORAL_CAPSULE | Freq: Once | ORAL | Status: AC
  Administered 2024-02-25: 300 mg via ORAL

## 2024-02-25 MED ORDER — OXYCODONE HCL 5 MG/5ML PO SOLN: 5.0000 mg | Freq: Once | ORAL | Status: AC | PRN

## 2024-02-25 MED ORDER — PROPOFOL 10 MG/ML IV BOLUS
INTRAVENOUS | Status: AC
  Filled 2024-02-25: qty 20

## 2024-02-25 MED ORDER — LACTATED RINGERS IV SOLN: INTRAVENOUS | Status: DC

## 2024-02-25 MED ORDER — ONDANSETRON HCL 4 MG PO TABS: 4.0000 mg | ORAL_TABLET | Freq: Three times a day (TID) | ORAL | 0 refills | Status: AC | PRN

## 2024-02-25 MED ORDER — ACETAMINOPHEN 500 MG PO TABS
ORAL_TABLET | ORAL | Status: AC
  Filled 2024-02-25: qty 2

## 2024-02-25 SURGICAL SUPPLY — 24 items
BNDG ELASTIC 6INX 5YD STR LF (GAUZE/BANDAGES/DRESSINGS) ×2 IMPLANT
BNDG ELASTIC 6X10 VLCR STRL LF (GAUZE/BANDAGES/DRESSINGS) IMPLANT
CHLORAPREP W/TINT 26 (MISCELLANEOUS) ×2 IMPLANT
CLSR STERI-STRIP ANTIMIC 1/2X4 (GAUZE/BANDAGES/DRESSINGS) ×2 IMPLANT
DISSECTOR 4.0MMX13CM CVD (MISCELLANEOUS) ×2 IMPLANT
DRAPE U-SHAPE 47X51 STRL (DRAPES) ×2 IMPLANT
DRAPE-T ARTHROSCOPY W/POUCH (DRAPES) ×2 IMPLANT
GAUZE SPONGE 4X4 12PLY STRL (GAUZE/BANDAGES/DRESSINGS) ×2 IMPLANT
GLOVE BIO SURGEON STRL SZ 6.5 (GLOVE) ×2 IMPLANT
GLOVE BIOGEL PI IND STRL 6.5 (GLOVE) ×2 IMPLANT
GLOVE BIOGEL PI IND STRL 8 (GLOVE) ×2 IMPLANT
GLOVE ECLIPSE 8.0 STRL XLNG CF (GLOVE) ×4 IMPLANT
GOWN STRL REUS W/ TWL LRG LVL3 (GOWN DISPOSABLE) ×2 IMPLANT
GOWN STRL REUS W/TWL XL LVL3 (GOWN DISPOSABLE) ×2 IMPLANT
KIT TURNOVER KIT B (KITS) ×2 IMPLANT
MANIFOLD NEPTUNE II (INSTRUMENTS) IMPLANT
PACK ARTHROSCOPY DSU (CUSTOM PROCEDURE TRAY) ×2 IMPLANT
SLEEVE SCD COMPRESS KNEE MED (STOCKING) ×2 IMPLANT
STRIP CLOSURE SKIN 1/2X4 (GAUZE/BANDAGES/DRESSINGS) IMPLANT
SUT MNCRL AB 4-0 PS2 18 (SUTURE) ×2 IMPLANT
TOWEL GREEN STERILE FF (TOWEL DISPOSABLE) ×2 IMPLANT
TUBE CONNECTING 20X1/4 (TUBING) ×2 IMPLANT
TUBING ARTHROSCOPY IRRIG 16FT (MISCELLANEOUS) ×2 IMPLANT
WAND ABLATOR APOLLO I90 (BUR) IMPLANT

## 2024-02-25 NOTE — Op Note (Signed)
 Orthopaedic Surgery Operative Note (CSN: [id number])  Edward Johnson  [birth year] Date of Surgery: 02/25/2024   Diagnoses:  medial meniscus tear of left knee, Chondromalacia patellae of left knee  Procedure: Left medial partial meniscectomy 29881   Operative Finding Full motion, no instability.  Medial grade 1-2 wear, complex posteromedial tear with complete radialtransection of fibers and parrot beak component in posterior knee.  30% total meniscal volume resected, grade 3/4 changes on trochlea and patella.  Lateral compartment normal.  Successful completion of the planned procedure.    Post-operative plan: The patient will be WBAT.  The patient will be discharged home.  DVT prophylaxis Aspirin  81 mg twice daily for 6 weeks.  Pain control with PRN pain medication preferring oral medicines.  Follow up plan will be scheduled in approximately 7 days for incision check.  Post-Op Diagnosis: Same Surgeons:Primary: Cristy Bonner DASEN, MD Assistants:Caroline McBane, PA-C Location: [facility] OR ROOM 6 Anesthesia: General with local Antibiotics: Ancef 3 g Tourniquet time:  Estimated Blood Loss: Minimal Complications: None Specimens: None Implants: * No implants in log *  Indications for Surgery:   Edward Johnson is a 53 y.o. male with meniscus tear and mechanical symptoms.  Benefits and risks of operative and nonoperative management were discussed prior to surgery with patient/guardian(s) and informed consent form was completed.  Specific risks including infection, need for additional surgery, postmeniscectomy syndrome, continued arthrosis and pain amongst others.   Procedure:   The patient was identified properly. Informed consent was obtained and the surgical site was marked. The patient was taken up to suite where general anesthesia was induced. The patient was placed in the supine position with a post against the surgical leg and a nonsterile tourniquet applied. The surgical leg was then prepped and  draped usual sterile fashion.  A standard surgical timeout was performed.  2 standard anterior portals were made and diagnostic arthroscopy performed. Please note the findings as noted above.  We used a shaver to perform a synovectomy of the anterior medial and anterolateral compartments as well as overgrowth along the patella.  We performed a medial meniscectomy using a shaver and basket back to a stable base removing all loose fragments.  Incisions closed with absorbable suture. The patient was awoken from general anesthesia and taken to the PACU in stable condition without complication.   Aleck Stalling, PA-C, present and scrubbed throughout the case, critical for completion in a timely fashion, and for retraction, instrumentation, closure.

## 2024-02-25 NOTE — Anesthesia Preprocedure Evaluation (Addendum)
 Anesthesia Evaluation  Patient identified by MRN, date of birth, ID band Patient awake    Reviewed: Allergy & Precautions, NPO status , Patient's Chart, lab work & pertinent test results  Airway Mallampati: I  TM Distance: >3 FB Neck ROM: Full    Dental no notable dental hx. (+) Teeth Intact, Dental Advisory Given   Pulmonary neg pulmonary ROS   Pulmonary exam normal breath sounds clear to auscultation       Cardiovascular hypertension, Pt. on medications Normal cardiovascular exam+ dysrhythmias Atrial Fibrillation  Rhythm:Regular Rate:Normal  TTE 2021 1. Left ventricular ejection fraction, by estimation, is 60 to 65%. The  left ventricle has normal function. The left ventricle has no regional  wall motion abnormalities. There is moderate concentric left ventricular  hypertrophy. Left ventricular  diastolic parameters were normal.   2. Right ventricular systolic function is normal. The right ventricular  size is normal.   3. The mitral valve is normal in structure. No evidence of mitral valve  regurgitation. No evidence of mitral stenosis.   4. The aortic valve is normal in structure. Aortic valve regurgitation is  not visualized. No aortic stenosis is present.     Neuro/Psych  PSYCHIATRIC DISORDERS Anxiety Depression    negative neurological ROS     GI/Hepatic Neg liver ROS,GERD  ,,  Endo/Other    Class 3 obesity (BMI 45)  Renal/GU negative Renal ROS  negative genitourinary   Musculoskeletal negative musculoskeletal ROS (+)    Abdominal   Peds  Hematology  (+) Blood dyscrasia (xarelto )   Anesthesia Other Findings   Reproductive/Obstetrics                              Anesthesia Physical Anesthesia Plan  ASA: 3  Anesthesia Plan: General   Post-op Pain Management:    Induction: Intravenous  PONV Risk Score and Plan: Ondansetron, Dexamethasone and Midazolam   Airway Management  Planned: LMA  Additional Equipment:   Intra-op Plan:   Post-operative Plan: Extubation in OR  Informed Consent: I have reviewed the patients History and Physical, chart, labs and discussed the procedure including the risks, benefits and alternatives for the proposed anesthesia with the patient or authorized representative who has indicated his/her understanding and acceptance.     Dental advisory given  Plan Discussed with: CRNA  Anesthesia Plan Comments:          Anesthesia Quick Evaluation

## 2024-02-25 NOTE — Anesthesia Procedure Notes (Signed)
 Procedure Name: LMA Insertion Date/Time: 02/25/2024 12:39 PM  Performed by: Debarah Chiquita LABOR, CRNAPre-anesthesia Checklist: Patient identified, Emergency Drugs available, Suction available and Patient being monitored Patient Re-evaluated:Patient Re-evaluated prior to induction Oxygen Delivery Method: Circle system utilized Preoxygenation: Pre-oxygenation with 100% oxygen Induction Type: IV induction Ventilation: Mask ventilation without difficulty LMA: LMA inserted LMA Size: 5.0 Number of attempts: 1 Airway Equipment and Method: Bite block Placement Confirmation: positive ETCO2 Tube secured with: Tape Dental Injury: Teeth and Oropharynx as per pre-operative assessment

## 2024-02-25 NOTE — Interval H&P Note (Signed)
 All questions answered, patient wants to proceed with procedure. ? ?

## 2024-02-25 NOTE — Transfer of Care (Signed)
 Immediate Anesthesia Transfer of Care Note  Patient: Edward Johnson  Procedure(s) Performed: ARTHROSCOPY, KNEE, WITH MEDIAL MENISCECTOMY (Left: Knee) CHONDROPLASTY (Left)  Patient Location: PACU  Anesthesia Type:General  Level of Consciousness: drowsy  Airway & Oxygen Therapy: Patient Spontanous Breathing and Patient connected to face mask oxygen  Post-op Assessment: Report given to RN and Post -op Vital signs reviewed and stable  Post vital signs: Reviewed and stable  Last Vitals:  Vitals Value Taken Time  BP 161/99 02/25/24 13:18  Temp    Pulse 61 02/25/24 13:20  Resp 10 02/25/24 13:20  SpO2 100 % 02/25/24 13:20  Vitals shown include unfiled device data.  Last Pain:  Vitals:   02/25/24 1000  TempSrc: Temporal  PainSc: 0-No pain         Complications: No notable events documented.

## 2024-02-26 ENCOUNTER — Encounter (HOSPITAL_BASED_OUTPATIENT_CLINIC_OR_DEPARTMENT_OTHER): Payer: Self-pay | Admitting: Orthopaedic Surgery

## 2024-02-26 NOTE — Anesthesia Postprocedure Evaluation (Signed)
 Anesthesia Post Note  Patient: Edward Johnson  Procedure(s) Performed: ARTHROSCOPY, KNEE, WITH MEDIAL MENISCECTOMY (Left: Knee) CHONDROPLASTY (Left)     Patient location during evaluation: PACU Anesthesia Type: General Level of consciousness: awake and alert Pain management: pain level controlled Vital Signs Assessment: post-procedure vital signs reviewed and stable Respiratory status: spontaneous breathing, nonlabored ventilation, respiratory function stable and patient connected to nasal cannula oxygen Cardiovascular status: blood pressure returned to baseline and stable Postop Assessment: no apparent nausea or vomiting Anesthetic complications: no   No notable events documented.  Last Vitals:  Vitals:   02/25/24 1430 02/25/24 1450  BP: (!) 176/99 (!) 176/85  Pulse: 60 64  Resp: 12 18  Temp:  (!) 36.3 C  SpO2: 98% 97%    Last Pain:  Vitals:   02/25/24 1450  TempSrc: Temporal  PainSc: 6    Pain Goal:                   [name]

## 2024-03-24 ENCOUNTER — Ambulatory Visit (INDEPENDENT_AMBULATORY_CARE_PROVIDER_SITE_OTHER): Admitting: Internal Medicine

## 2024-04-14 ENCOUNTER — Ambulatory Visit (INDEPENDENT_AMBULATORY_CARE_PROVIDER_SITE_OTHER): Admitting: Adult Health

## 2024-05-05 ENCOUNTER — Ambulatory Visit (INDEPENDENT_AMBULATORY_CARE_PROVIDER_SITE_OTHER): Admitting: Family Medicine
